# Patient Record
Sex: Female | Born: 1960 | Race: White | Hispanic: No | Marital: Married | Smoking: Never smoker
Health system: Southern US, Community
[De-identification: ages and names within clinical notes are randomized; demographics above are authoritative.]

## PROBLEM LIST (undated history)

## (undated) DIAGNOSIS — E2839 Other primary ovarian failure: Secondary | ICD-10-CM

## (undated) DIAGNOSIS — D649 Anemia, unspecified: Secondary | ICD-10-CM

## (undated) DIAGNOSIS — N952 Postmenopausal atrophic vaginitis: Secondary | ICD-10-CM

## (undated) DIAGNOSIS — R011 Cardiac murmur, unspecified: Secondary | ICD-10-CM

## (undated) DIAGNOSIS — E288 Other ovarian dysfunction: Secondary | ICD-10-CM

## (undated) DIAGNOSIS — H409 Unspecified glaucoma: Secondary | ICD-10-CM

## (undated) DIAGNOSIS — R51 Headache: Secondary | ICD-10-CM

## (undated) HISTORY — DX: Postmenopausal atrophic vaginitis: N95.2

## (undated) HISTORY — DX: Cardiac murmur, unspecified: R01.1

## (undated) HISTORY — DX: Unspecified glaucoma: H40.9

## (undated) HISTORY — DX: Other ovarian dysfunction: E28.8

## (undated) HISTORY — DX: Anemia, unspecified: D64.9

## (undated) HISTORY — DX: Other primary ovarian failure: E28.39

## (undated) HISTORY — DX: Headache: R51

---

## 1985-12-25 HISTORY — PX: TUBAL LIGATION: SHX77

## 1999-01-03 ENCOUNTER — Other Ambulatory Visit: Admission: RE | Admit: 1999-01-03 | Discharge: 1999-01-03 | Payer: Self-pay | Admitting: Gynecology

## 2000-04-17 ENCOUNTER — Other Ambulatory Visit: Admission: RE | Admit: 2000-04-17 | Discharge: 2000-04-17 | Payer: Self-pay | Admitting: Gynecology

## 2003-12-10 ENCOUNTER — Other Ambulatory Visit: Admission: RE | Admit: 2003-12-10 | Discharge: 2003-12-10 | Payer: Self-pay | Admitting: *Deleted

## 2005-04-17 ENCOUNTER — Other Ambulatory Visit: Admission: RE | Admit: 2005-04-17 | Discharge: 2005-04-17 | Payer: Self-pay | Admitting: Gynecology

## 2005-04-18 ENCOUNTER — Ambulatory Visit: Payer: Self-pay | Admitting: Internal Medicine

## 2005-11-08 ENCOUNTER — Ambulatory Visit (HOSPITAL_COMMUNITY): Admission: RE | Admit: 2005-11-08 | Discharge: 2005-11-08 | Payer: Self-pay | Admitting: Family Medicine

## 2007-02-02 ENCOUNTER — Emergency Department (HOSPITAL_COMMUNITY): Admission: EM | Admit: 2007-02-02 | Discharge: 2007-02-02 | Payer: Self-pay | Admitting: Emergency Medicine

## 2007-02-19 ENCOUNTER — Encounter (HOSPITAL_COMMUNITY): Admission: RE | Admit: 2007-02-19 | Discharge: 2007-03-21 | Payer: Self-pay | Admitting: Orthopaedic Surgery

## 2009-07-20 ENCOUNTER — Other Ambulatory Visit: Admission: RE | Admit: 2009-07-20 | Discharge: 2009-07-20 | Payer: Self-pay | Admitting: Obstetrics & Gynecology

## 2009-08-04 ENCOUNTER — Ambulatory Visit (HOSPITAL_COMMUNITY): Admission: RE | Admit: 2009-08-04 | Discharge: 2009-08-04 | Payer: Self-pay | Admitting: Obstetrics & Gynecology

## 2010-12-25 DIAGNOSIS — H409 Unspecified glaucoma: Secondary | ICD-10-CM

## 2010-12-25 HISTORY — DX: Unspecified glaucoma: H40.9

## 2011-12-13 ENCOUNTER — Other Ambulatory Visit (HOSPITAL_COMMUNITY): Payer: Self-pay | Admitting: Internal Medicine

## 2011-12-13 DIAGNOSIS — Z139 Encounter for screening, unspecified: Secondary | ICD-10-CM

## 2012-03-19 DIAGNOSIS — N952 Postmenopausal atrophic vaginitis: Secondary | ICD-10-CM

## 2012-03-19 HISTORY — DX: Postmenopausal atrophic vaginitis: N95.2

## 2012-03-19 LAB — HM PAP SMEAR: HM Pap smear: NORMAL

## 2013-02-10 ENCOUNTER — Encounter: Payer: Self-pay | Admitting: Nurse Practitioner

## 2013-03-21 ENCOUNTER — Encounter: Payer: Self-pay | Admitting: Nurse Practitioner

## 2013-03-21 ENCOUNTER — Ambulatory Visit: Payer: Self-pay | Admitting: Nurse Practitioner

## 2013-03-21 DIAGNOSIS — Z01419 Encounter for gynecological examination (general) (routine) without abnormal findings: Secondary | ICD-10-CM

## 2013-12-01 ENCOUNTER — Ambulatory Visit: Payer: Self-pay | Admitting: Nurse Practitioner

## 2013-12-08 ENCOUNTER — Encounter: Payer: Self-pay | Admitting: Nurse Practitioner

## 2013-12-08 ENCOUNTER — Ambulatory Visit (INDEPENDENT_AMBULATORY_CARE_PROVIDER_SITE_OTHER): Payer: Federal, State, Local not specified - PPO | Admitting: Nurse Practitioner

## 2013-12-08 VITALS — BP 138/92 | HR 80 | Ht 64.5 in | Wt 149.0 lb

## 2013-12-08 DIAGNOSIS — Z0000 Encounter for general adult medical examination without abnormal findings: Secondary | ICD-10-CM

## 2013-12-08 DIAGNOSIS — Z01419 Encounter for gynecological examination (general) (routine) without abnormal findings: Secondary | ICD-10-CM

## 2013-12-08 LAB — POCT URINALYSIS DIPSTICK
Bilirubin, UA: NEGATIVE
Blood, UA: NEGATIVE
Glucose, UA: NEGATIVE
Ketones, UA: NEGATIVE
Leukocytes, UA: NEGATIVE
Nitrite, UA: NEGATIVE
Protein, UA: NEGATIVE
Urobilinogen, UA: NEGATIVE
pH, UA: 8

## 2013-12-08 LAB — HEMOGLOBIN, FINGERSTICK: Hemoglobin, fingerstick: 11.8 g/dL — ABNORMAL LOW (ref 12.0–16.0)

## 2013-12-08 NOTE — Progress Notes (Signed)
Patient ID: Brooke Welch, female   DOB: 12/14/1961, 52 y.o.   MRN: [id number] 52 y.o. G27P2002 Married Caucasian Fe here for annual exam.  No new health problems.  Less vaginal dryness than last year.  No LMP recorded. Patient is postmenopausal.          Sexually active: yes  The current method of family planning is post menopausal status.    Exercising: yes  Home exercise routine includes treadmill every day. Smoker:  no  Health Maintenance: Pap:  03/19/12, WNL, neg HR HPV MMG:  04/03/12, Bi-Rads 2: benign findings Colonoscopy:  Never - declines BMD:   04/03/2012 TDaP:  03/19/12 Labs: HB: 11.8 Urine: negative, pH 8.0   reports that she has never smoked. She has never used smokeless tobacco. She reports that she does not drink alcohol or use illicit drugs.  Past Medical History  Diagnosis Date  . GERD (gastroesophageal reflux disease)   . 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 Heart murmur   . Premature ovarian failure age 70  . Headache(784.0)     migraine  . Anemia     hx.iron deficiency     Past Surgical History  Procedure Laterality Date  . Tubal ligation      Current Outpatient Prescriptions  Medication Sig Dispense Refill  . Calcium Carbonate-Vit D-Min (CALCIUM 1200 PO) Take 1 tablet by mouth daily.      . hydrOXYzine (ATARAX/VISTARIL) 25 MG tablet Take 1 tablet by mouth daily.      Marland Kitchen LUMIGAN 0.01 % SOLN Place 1 drop into both eyes at bedtime.      . potassium chloride (MICRO-K) 10 MEQ CR capsule Take 1 capsule by mouth daily.       No current facility-administered medications for this visit.    Family History  Problem Relation Age of Onset  . Diabetes Mother   . Hypertension Mother   . Diabetes Maternal Grandmother   . Hypertension Maternal Grandfather   . Heart attack Maternal Grandfather   . Hypertension Brother   . Heart disease Father     ROS:  Pertinent items are noted in HPI.  Otherwise, a comprehensive ROS was negative.  Exam:   BP 138/92  Pulse 80  Ht  5' 4.5" (1.638 m)  Wt 149 lb (67.586 kg)  BMI 25.19 kg/m2 Height: 5' 4.5" (163.8 cm)  Ht Readings from Last 3 Encounters:  12/08/13 5' 4.5" (1.638 m)    General appearance: alert, cooperative and appears stated age Head: Normocephalic, without obvious abnormality, atraumatic Neck: no adenopathy, supple, symmetrical, trachea midline and thyroid normal to inspection and palpation Lungs: clear to auscultation bilaterally Breasts: normal appearance, no masses or tenderness Heart: regular rate and rhythm Abdomen: soft, non-tender; no masses,  no organomegaly Extremities: extremities normal, atraumatic, no cyanosis or edema Skin: Skin color, texture, turgor normal. No rashes or lesions.  Multiple tattoos over back. Chest., right things and some on the arms. Lymph nodes: Cervical, supraclavicular, and axillary nodes normal. No abnormal inguinal nodes palpated Neurologic: Grossly normal   Pelvic: External genitalia:  no lesions              Urethra:  normal appearing urethra with no masses, tenderness or lesions              Bartholin's and Skene's: normal                 Vagina: normal appearing vagina with normal color and discharge, no lesions  Cervix: anteverted              Pap taken: no Bimanual Exam:  Uterus:  normal size, contour, position, consistency, mobility, non-tender              Adnexa: no mass, fullness, tenderness               Rectovaginal: Confirms               Anus:  normal sphincter tone, no lesions  A:  Well Woman with normal exam  Postmenopausal at age 41 - no HRT  P:   Pap smear as per guidelines  Mammo to be scheduled by end of the year  Will return for routine labs - and she will forward them to PCP  She has info on Colonoscopy when ready to schedule checking on insurance coverage   Counseled on breast self exam, mammography screening, adequate intake of calcium and vitamin D, diet and exercise return annually or prn  An After Visit Summary  was printed and given to the patient.

## 2013-12-08 NOTE — Patient Instructions (Signed)

## 2013-12-09 NOTE — Progress Notes (Signed)
Reviewed personally.  M. Suzanne Mariam Helbert, MD.  

## 2013-12-22 ENCOUNTER — Other Ambulatory Visit (INDEPENDENT_AMBULATORY_CARE_PROVIDER_SITE_OTHER): Payer: Federal, State, Local not specified - PPO

## 2013-12-22 DIAGNOSIS — Z0000 Encounter for general adult medical examination without abnormal findings: Secondary | ICD-10-CM

## 2013-12-22 LAB — LIPID PANEL
Cholesterol: 199 mg/dL (ref 0–200)
HDL: 56 mg/dL (ref >39)
LDL Cholesterol: 125 mg/dL — ABNORMAL HIGH (ref 0–99)
Total CHOL/HDL Ratio: 3.6 Ratio
Triglycerides: 88 mg/dL (ref <150)
VLDL: 18 mg/dL (ref 0–40)

## 2013-12-22 LAB — COMPREHENSIVE METABOLIC PANEL
ALT: 35 U/L (ref 0–35)
AST: 40 U/L — ABNORMAL HIGH (ref 0–37)
Albumin: 4.1 g/dL (ref 3.5–5.2)
Alkaline Phosphatase: 100 U/L (ref 39–117)
BUN: 17 mg/dL (ref 6–23)
CO2: 28 mEq/L (ref 19–32)
Calcium: 9.3 mg/dL (ref 8.4–10.5)
Chloride: 104 mEq/L (ref 96–112)
Creat: 0.61 mg/dL (ref 0.50–1.10)
Glucose, Bld: 91 mg/dL (ref 70–99)
Potassium: 4.6 mEq/L (ref 3.5–5.3)
Sodium: 139 mEq/L (ref 135–145)
Total Bilirubin: 0.5 mg/dL (ref 0.3–1.2)
Total Protein: 6 g/dL (ref 6.0–8.3)

## 2013-12-22 LAB — TSH: TSH: 1.919 u[IU]/mL (ref 0.350–4.500)

## 2013-12-23 ENCOUNTER — Telehealth: Payer: Self-pay | Admitting: *Deleted

## 2013-12-23 DIAGNOSIS — R945 Abnormal results of liver function studies: Secondary | ICD-10-CM

## 2013-12-23 LAB — VITAMIN D 25 HYDROXY (VIT D DEFICIENCY, FRACTURES): Vit D, 25-Hydroxy: 55 ng/mL (ref 30–89)

## 2013-12-23 NOTE — Telephone Encounter (Signed)
Pt returning call

## 2013-12-23 NOTE — Telephone Encounter (Signed)
I have attempted to contact this patient by phone with the following results: left message to return my call on answering machine (mobile).  

## 2013-12-23 NOTE — Telephone Encounter (Signed)
Message copied by Luisa Dago on Tue Dec 23, 2013  9:41 AM ------      Message from: Ria Comment R      Created: Tue Dec 23, 2013  8:32 AM       Let patient know that one liver function test is elevated.  She needs to hold any NSAID'S and ETOH and repeat LFT's in 2 weeks. All else was normal except elevated LDL at 125. ------

## 2013-12-23 NOTE — Telephone Encounter (Signed)
Pt notified of results.  Order entered, lab appt made.

## 2014-01-06 ENCOUNTER — Telehealth: Payer: Self-pay | Admitting: Nurse Practitioner

## 2014-01-06 ENCOUNTER — Other Ambulatory Visit: Payer: Self-pay

## 2014-01-06 NOTE — Telephone Encounter (Signed)
Called pt regarding missed lab appt. To call if she wish to reschedule.

## 2014-10-26 ENCOUNTER — Encounter: Payer: Self-pay | Admitting: Nurse Practitioner

## 2014-12-10 ENCOUNTER — Ambulatory Visit: Payer: Federal, State, Local not specified - PPO | Admitting: Nurse Practitioner

## 2015-02-01 ENCOUNTER — Encounter: Payer: Self-pay | Admitting: Nurse Practitioner

## 2015-02-01 ENCOUNTER — Ambulatory Visit: Payer: Federal, State, Local not specified - PPO | Admitting: Nurse Practitioner

## 2015-08-04 ENCOUNTER — Encounter: Payer: Self-pay | Admitting: Nurse Practitioner

## 2016-02-16 ENCOUNTER — Encounter: Payer: Self-pay | Admitting: Allergy and Immunology

## 2016-02-16 ENCOUNTER — Ambulatory Visit (INDEPENDENT_AMBULATORY_CARE_PROVIDER_SITE_OTHER): Payer: Federal, State, Local not specified - PPO | Admitting: Allergy and Immunology

## 2016-02-16 ENCOUNTER — Ambulatory Visit: Payer: Federal, State, Local not specified - PPO | Admitting: Allergy and Immunology

## 2016-02-16 VITALS — BP 120/80 | HR 78 | Temp 98.3°F | Resp 16 | Ht 64.17 in | Wt 145.9 lb

## 2016-02-16 DIAGNOSIS — J310 Chronic rhinitis: Secondary | ICD-10-CM | POA: Diagnosis not present

## 2016-02-16 DIAGNOSIS — R05 Cough: Secondary | ICD-10-CM | POA: Diagnosis not present

## 2016-02-16 DIAGNOSIS — L509 Urticaria, unspecified: Secondary | ICD-10-CM

## 2016-02-16 DIAGNOSIS — R059 Cough, unspecified: Secondary | ICD-10-CM

## 2016-02-16 MED ORDER — EPINEPHRINE 0.3 MG/0.3ML IJ SOAJ: 0.3000 mg | Freq: Once | INTRAMUSCULAR | Status: AC

## 2016-02-16 NOTE — Patient Instructions (Addendum)
   Prednisone  now and  once daily for 2 days then  once daily for 2 days.  Zyrtec  once each morning.  Continue Zantac  once daily.  Continue Hyrdroxyzine at bedtime as needed for itching/hives.  ProAir 2 puffs every 6 hours as needed for cough.  Epi-pen/Benadryl as needed.  Keep written diary of any further hive episodes and take picture.  Return to office in the next month with plans for skin testing off antihistamines 72 hours prior to appointment.

## 2016-02-16 NOTE — Progress Notes (Signed)
NEW PATIENT NOTE  RE: Brooke Welch MRN: [id number] DOB: 09-Jul-1961 [facility] [address] [place] [address] Date of Office Visit: 02/16/2016  Dear Brooke Nevins, MD:  I had the pleasure of seeing Brooke Welch today in initial evaluation as you recall-- Subjective:  [name] is a 55 y.o. female who presents today for Urticaria and Shortness of Breath  Assessment:   1. Cough, possible component of bronchospasm, clear lung exam with normal oxygenation.   2. Hives, recent episode improved on Prednisone.   3. Chronic rhinitis, suspected allergic etiology.    Plan:   Meds ordered this encounter  Medications  . EPINEPHrine (EPIPEN 2-PAK) 0.3 mg/0.3 mL IJ SOAJ injection    Sig: Inject 0.3 mLs (0.3 mg total) into the muscle once.    Dispense:  2 Device    Refill:  2  . albuterol (PROAIR HFA) 108 (90 Base) MCG/ACT inhaler    Sig: Inhale 2 puffs into the lungs every 6 (six) hours as needed for wheezing (or cough.).    Dispense:  1 Inhaler    Refill:  1   Patient Instructions  1.  Prednisone  now and  once daily for 2 days then  once daily for 2 days. 2.  Zyrtec  once each morning. 3.  Continue Zantac  once daily. 4.  Continue Hyrdroxyzine at bedtime as needed for itching/hives. 5.  ProAir 2 puffs every 6 hours as needed for cough. 6.  Epi-pen/Benadryl as needed. 7.  Keep written diary of any further hive episodes and take picture. 8.  Release signed for copy of recent lab testing results. 9.  Return to office in the next month with plans for skin testing off antihistamines 72 hours prior to appointment.   HPI: Brooke Welch, who has been evaluated in our office with chronic urticaria and intermittent rhinitis (2011) presents with intermittent pruritic erythematous migrating hives over recurring years.  Some time since her last visit, [name] reports having testing it appears with  Allergy with noted positives.  Apparently she was hive free for some time with rare episodes, typically not requiring frequent management and therefore did not follow-up here.  However, in the last several weeks she has noted significant pruritus at her extremities, neck, chest (without swelling of her lips, tongue or throat) only partial improvement with Benadryl.  Because of increasing difficulty, she saw her primary care physician 2 weeks ago and completed a course of prednisone and more consistent Benadryl use, then Atarax as well as selected laboratory evaluation for "auto-immune disease".  She notices in the recent days, intermittent throat irritation with throat clearing cough without wheezing, difficulty breathing, chest congestion or tightness.  Last night she felt her itching was greater with chest irritation, prompting administration of Benadryl and Zantac and her last scheduled dose of prednisone.  She denies any preceding febrile, respiratory or GI illness prior to her current skin difficulties.  No change in appetite, activity, sleep, environmental or product exposures. Yesterday lunch was oranges and dinner likely was cucumber, tomato, cheese salad with [place] dressing.  She denies any specific food exposures or triggers as consistent.   Typically she notices animal dander, pollen, outdoor and fluctuant  weather patterns, as provoking factors for rhinorrhea, congestion, sneezing. Denies ED or Urgent care visits, or antibiotic courses.  Medical History: Past Medical History  Diagnosis Date  . 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 Heart murmur   . Premature ovarian failure age 19  .  Headache(784.0)     migraine  . Anemia     hx.iron deficiency   . Glaucoma 2012   Surgical History: Past Surgical History  Procedure Laterality Date  . Tubal ligation  1987   Family History: Family History  Problem Relation Age of Onset  . Diabetes Mother   . Hypertension Mother   . Diabetes Maternal Grandmother   . Hypertension  Maternal Grandfather   . Heart attack Maternal Grandfather   . Hypertension Brother   . Asthma Brother   . Heart disease Father   . Asthma Father   . Urticaria Neg Hx   . Allergic rhinitis Neg Hx   . Angioedema Neg Hx   . Atopy Neg Hx   . Immunodeficiency Neg Hx   . Eczema Neg Hx    Social History: Social History  . Marital Status: Married    Spouse Name: N/A  . Number of Children: N/A  . Years of Education: N/A    Social History Main Topics  . Smoking status: Never Smoker   . Smokeless tobacco: Never Used  . Alcohol Use: No  . Drug Use: No  . 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   Brooke Welch has a current medication list which includes the following prescription(s): calcium carbonate-vit d-min, ferrous sulfate, hydrochlorothiazide, hydroxyzine, lumigan, timolol.   Drug Allergies: No Known Allergies  Environmental History: Brooke Welch lives in a 56 year old house for  8 years, wood floors, central air and heat; stuffed mattress, non feather pillow and comforter with indoor poodle and secondary smoke exposure.  Review of Systems  Constitutional: Negative for fever, weight loss and malaise/fatigue.  HENT: Positive for congestion. Negative for ear pain, hearing loss, nosebleeds and sore throat.   Eyes: Negative for blurred vision, discharge and redness.       Corrective contact lenses.  Respiratory: Negative for shortness of breath.        Denies history of bronchitis and pneumonia.  Gastrointestinal: Negative for heartburn, nausea, vomiting, abdominal pain, diarrhea and constipation.  Genitourinary: Negative.   Musculoskeletal: Negative for myalgias and joint pain.  Skin: Positive for itching and rash.  Neurological: Negative.  Negative for dizziness, seizures,  weakness and headaches.  Endo/Heme/Allergies: Positive for environmental allergies.       Denies sensitivity to aspirin, NSAIDs, stinging insects, foods, latex, jewelry and cosmetics.   Objective:   Filed Vitals:   02/16/16 1408  BP: 120/80  Pulse: 78  Temp: 98.3 F (36.8 C)  Resp: 16   SpO2 Readings from Last 1 Encounters:  02/16/16 98%   Physical Exam  Constitutional: She is well-developed, well-nourished, and in no distress.  HENT:  Head: Atraumatic.  Right Ear: Tympanic membrane and ear canal normal.  Left Ear: Tympanic membrane and ear canal normal.  Nose: Mucosal edema present. No rhinorrhea. No epistaxis.  Mouth/Throat: Oropharynx is clear and moist and mucous membranes are normal. No oropharyngeal exudate, posterior oropharyngeal edema or posterior oropharyngeal erythema.  Eyes: Conjunctivae are normal.  Neck: Neck supple.  Cardiovascular: Normal rate, S1 normal and S2 normal.   No murmur heard. Pulmonary/Chest: Effort normal. She has no wheezes. She has no rhonchi. She has no rales.  Abdominal: Soft. Normal appearance and bowel sounds are normal.  Musculoskeletal: She exhibits no edema.  Lymphadenopathy:    She  has no cervical adenopathy.  Neurological: She is alert.  Skin: Skin is warm and intact. No rash noted. No cyanosis. Nails show no clubbing.  Few tiny macular areas at right arm, otherwise clear.   Diagnostics: Spirometry:  FVC 2.05-60%, FEV1 1.90-71% , FEF 25-75 percent 2.74-108%.  Postbronchodilator essentially no change, (then repeat postbronchodilator on different spirometry machine showed improvement 1.99-64%, FEV1 1.88--75%, FEF 25-75% 2.91--112%).    Roselyn M. Willa Rough, MD   cc: Cassell Smiles., MD

## 2016-02-18 ENCOUNTER — Encounter: Payer: Self-pay | Admitting: Allergy and Immunology

## 2016-02-18 MED ORDER — ALBUTEROL SULFATE HFA 108 (90 BASE) MCG/ACT IN AERS: 2.0000 | INHALATION_SPRAY | Freq: Four times a day (QID) | RESPIRATORY_TRACT | Status: DC | PRN

## 2016-03-16 ENCOUNTER — Ambulatory Visit (INDEPENDENT_AMBULATORY_CARE_PROVIDER_SITE_OTHER): Payer: Federal, State, Local not specified - PPO | Admitting: Allergy and Immunology

## 2016-03-16 ENCOUNTER — Encounter: Payer: Self-pay | Admitting: Allergy and Immunology

## 2016-03-16 VITALS — BP 128/78 | HR 76 | Temp 98.1°F | Resp 16

## 2016-03-16 DIAGNOSIS — J310 Chronic rhinitis: Secondary | ICD-10-CM

## 2016-03-16 DIAGNOSIS — R05 Cough: Secondary | ICD-10-CM | POA: Diagnosis not present

## 2016-03-16 DIAGNOSIS — R059 Cough, unspecified: Secondary | ICD-10-CM

## 2016-03-16 DIAGNOSIS — L509 Urticaria, unspecified: Secondary | ICD-10-CM

## 2016-03-16 NOTE — Patient Instructions (Addendum)
  Review labs from Dr. Sherwood GamblerFusco and consider further evaluation.  Continue Zyrtec 10mg  twice daily.  Continue Zantac 150mg  twice daily.  Continue Hydroxyzine as needed.  Begin Singulair 10mg  each evening.  Information on Xolair.  Referral to Dermatology for biopsy---Dr. Ezequiel GanserHall [place].  Prednisone 10mg  daily for 3 days.

## 2016-03-17 ENCOUNTER — Telehealth: Payer: Self-pay

## 2016-03-17 NOTE — Telephone Encounter (Signed)
Spoke to patient and informed her about her  [place].  It is on May 3rd at 3pm.

## 2016-03-18 NOTE — Progress Notes (Signed)
     FOLLOW UP NOTE  RE: Brooke DanesCheryl M Mccranie MRN: [id number] DOB: 02-Aug-1961 [facility] [facility] [place] [address]  [place] [id number] Date of Office Visit: 03/16/2016  Subjective:  Brooke Welch is a 55 y.o. female who presents today for Urticaria and Face Swelling  Assessment:   1. Cough with clear lung exam.  2. Hives, recurrent unclear etiology.   3. Chronic rhinitis, presumed allergic component.   Plan:  1.  Review labs from Dr. Sherwood GamblerFusco and consider further laboratory evaluation. 2.  Continue Zyrtec 10mg  twice daily. 3.  Continue Zantac 150mg  twice daily. 4.  Continue Hydroxyzine as needed. 5.  Begin Singulair 10mg  each evening. 6.  Information on Xolair. 7.  Referral to Dermatology for biopsy---Dr. Ezequiel GanserHall [place]. 8.  Prednisone 10mg  daily for 3 days. 9.  Epi-pen/Benadryl as needed. 10. Follow-up by phone with additional management/appointment date and then in 2-3 months or sooner if needed.  HPI: Brooke Welch returns to the office with continued difficulty with hives. Since her Feb visit, she did well on prednisone with 100% improvement but several days later had increasing difficulty.  Eventually she spoke with Dr. Nunzio CobbsBobbitt on call, who increased her Zyrtec and Zantac and using Hydroxyzine regularly with a few additional Prednisone days.  She has continued to have several days of hives, most disruptive at her face/cheeks where areas are often quarter sized with significant itching.  No bruising or other skin changes.  She shows additional pictures today but does not appreciate specific triggers except increasing stressors related to her daughter.  Denies cough, difficulty in breathing, shortness of breath, fever, headache, GI upset or other associated symptoms.   No recent albuterol use. Denies ED or urgent care visits or antibiotic courses. Reports sleep and activity are normal.  Brooke Welch has a current medication list which includes the following  prescription(s): albuterol, calcium carbonate-vit d-min, cetirizine, epinephrine, ferrous sulfate, hydroxyzine, lumigan, ranitidine.   Drug Allergies: No Known Allergies  Objective:   Filed Vitals:   03/16/16 1042  BP: 128/78  Pulse: 76  Temp: 98.1 F (36.7 C)  Resp: 16   Physical Exam  Constitutional: She is well-developed, well-nourished, and in no distress.  HENT:  Head: Atraumatic.  Right Ear: Tympanic membrane and ear canal normal.  Left Ear: Tympanic membrane and ear canal normal.  Nose: Mucosal edema present. No rhinorrhea. No epistaxis.  Mouth/Throat: Oropharynx is clear and moist and mucous membranes are normal. No oropharyngeal exudate, posterior oropharyngeal edema or posterior oropharyngeal erythema.  Neck: Neck supple.  Cardiovascular: Normal rate, S1 normal and S2 normal.   No murmur heard. Pulmonary/Chest: Effort normal. She has no wheezes. She has no rhonchi. She has no rales.  Musculoskeletal:  No joint swelling or tenderness.  Lymphadenopathy:    She has no cervical adenopathy.  Skin: Skin is warm and intact. Rash noted. Rash is urticarial (blanching erythematous circular lesions greatest at arms and right cheek, without any bruising or other skin discoloration.).   Diagnostics: Spirometry:  FVC 2.00--65% FEV1 1.85--74%.    Roselyn M. Willa RoughHicks, MD  cc: Cassell SmilesFUSCO,LAWRENCE J., MD

## 2016-04-18 ENCOUNTER — Telehealth: Payer: Self-pay | Admitting: *Deleted

## 2016-04-18 NOTE — Telephone Encounter (Signed)
PT WANTS TO MAKE SURE WE FILED WITH BOTH OF HER INSURANCES.

## 2016-04-18 NOTE — Telephone Encounter (Signed)
LEFT MESSAGE - 0 PT BAL - MARCH VISIT PENDING WITH SECONDARY INS

## 2019-02-04 NOTE — Progress Notes (Signed)
Office Visit Note  Patient: Brooke Welch             Date of Birth: [birth year]           MRN: [id number]             PCP: Elfredia NevinsFusco, Lawrence, MD Referring: Nita SellsHall, John, MD Visit Date: 02/18/2019 Occupation: HR   Subjective:  History of hives.   History of Present Illness: Brooke Welch is a 58 y.o. female seen in consultation per request of Dr. Margo AyeHall.  According to patient her symptoms are started at age 89.  She has been having episodes of hives EN swelling of her mouth and fingers with the episodes.  She states she has recurrent problems since then.  She has been treated with several courses of steroids over the time.  She also has been taking antihistamines and Zantac.  She states the symptoms have been recurrent over the years.  She has had multiple allergy testing which was not very helpful.  She states her last episode was most severe where she had throat closing sensation.  She was given Depo-Medrol, oral prednisone and Tagamet which helped.  She has to take Tagamet and antihistamines on a regular basis.  She recalls in April 2017 she had some work-up done which showed positive ANA and positive SSB antibody for which she was seen by rheumatologist who also offered prednisone for treatment.  She denies any sicca symptoms.  There is no history of any joint pain or joint swelling in between the episodes.  There is no history of photosensitivity, Raynaud's phenomenon, malar rash, oral ulcers.  There is no family history of autoimmune disease.  Activities of Daily Living:  Patient reports morning stiffness for0 minute.   Patient Denies nocturnal pain.  Difficulty dressing/grooming: Denies Difficulty climbing stairs: Denies Difficulty getting out of chair: Denies Difficulty using hands for taps, buttons, cutlery, and/or writing: Denies  Review of Systems  Constitutional: Positive for fatigue. Negative for night sweats, weight gain and weight loss.  HENT: Negative for mouth sores, trouble  swallowing, trouble swallowing, mouth dryness and nose dryness.   Eyes: Negative for pain, redness, visual disturbance and dryness.  Respiratory: Negative for cough, shortness of breath and difficulty breathing.   Cardiovascular: Negative for chest pain, palpitations, hypertension, irregular heartbeat and swelling in legs/feet.  Gastrointestinal: Negative for blood in stool, constipation and diarrhea.  Endocrine: Negative for increased urination.  Genitourinary: Negative for vaginal dryness.  Musculoskeletal: Negative for arthralgias, joint pain, joint swelling, myalgias, muscle weakness, morning stiffness, muscle tenderness and myalgias.  Skin: Negative for color change, rash, hair loss, skin tightness, ulcers and sensitivity to sunlight.  Allergic/Immunologic: Negative for susceptible to infections.  Neurological: Negative for dizziness, memory loss, night sweats and weakness.  Hematological: Negative for swollen glands.  Psychiatric/Behavioral: Positive for sleep disturbance. Negative for depressed mood. The patient is not nervous/anxious.     PMFS History:  Patient Active Problem List   Diagnosis Date Noted  . History of anemia 02/18/2019  . 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 History of glaucoma 02/18/2019  . Heart murmur 02/18/2019  . History of asthma 02/18/2019    Past Medical History:  Diagnosis Date  . Anemia    hx.iron deficiency   . 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 Glaucoma 2012  . Headache(784.0)    migraine  . Heart murmur   . Premature ovarian failure age 58    Family History  Problem Relation Age of Onset  .  Diabetes Mother   . Hypertension Mother   . Diabetes Maternal Grandmother   . Hypertension Maternal Grandfather   . Heart attack Maternal Grandfather   . Hypertension Brother   . Asthma Brother   . Heart disease Father   . Asthma Father   . Healthy Son   . Drug abuse Daughter   . Urticaria Neg Hx   . Allergic rhinitis Neg Hx   . Angioedema Neg Hx    . Atopy Neg Hx   . Immunodeficiency Neg Hx   . Eczema Neg Hx    Past Surgical History:  Procedure Laterality Date  . TUBAL LIGATION  1987   Social History   Social History Narrative  . Not on file   Immunization History  Administered Date(s) Administered  . Tdap 03/19/2012     Objective: Vital Signs: BP (!) 162/103 (BP Location: Right Arm, Patient Position: Sitting, Cuff Size: Normal)   Pulse 77   Resp 12   Ht 5' 3.75" (1.619 m)   Wt 149 lb 9.6 oz (67.9 kg)   BMI 25.88 kg/m    Physical Exam Vitals signs and nursing note reviewed.  Constitutional:      Appearance: She is well-developed.  HENT:     Head: Normocephalic and atraumatic.  Eyes:     Conjunctiva/sclera: Conjunctivae normal.  Neck:     Musculoskeletal: Normal range of motion.  Cardiovascular:     Rate and Rhythm: Normal rate and regular rhythm.     Heart sounds: Normal heart sounds.  Pulmonary:     Effort: Pulmonary effort is normal.     Breath sounds: Normal breath sounds.  Abdominal:     General: Bowel sounds are normal.     Palpations: Abdomen is soft.  Lymphadenopathy:     Cervical: No cervical adenopathy.  Skin:    General: Skin is warm and dry.     Capillary Refill: Capillary refill takes less than 2 seconds.  Neurological:     Mental Status: She is alert and oriented to person, place, and time.  Psychiatric:        Behavior: Behavior normal.      Musculoskeletal Exam: C-spine, thoracic and lumbar spine good range of motion.  Shoulder joints elbow joints wrist joint MCPs PIPs DIPs with good range of motion with no synovitis.  Hip joints knee joints ankles MTPs PIPs been good range of motion with no synovitis.  CDAI Exam: CDAI Score: Not documented Patient Global Assessment: Not documented; Provider Global Assessment: Not documented Swollen: Not documented; Tender: Not documented Joint Exam   Not documented   There is currently no information documented on the homunculus. Go to the  Rheumatology activity and complete the homunculus joint exam.  Investigation: No additional findings.  Imaging: No results found.  Recent Labs: Lab Results  Component Value Date   HGB 11.8 (L) 12/08/2013   NA 139 12/22/2013   K 4.6 12/22/2013   CL 104 12/22/2013   CO2 28 12/22/2013   GLUCOSE 91 12/22/2013   BUN 17 12/22/2013   CREATININE 0.61 12/22/2013   BILITOT 0.5 12/22/2013   ALKPHOS 100 12/22/2013   AST 40 (H) 12/22/2013   ALT 35 12/22/2013   PROT 6.0 12/22/2013   ALBUMIN 4.1 12/22/2013   CALCIUM 9.3 12/22/2013    Speciality Comments: No specialty comments available.  Procedures:  No procedures performed Allergies: Patient has no known allergies.   Assessment / Plan:     Visit Diagnoses: Urticaria -patient has had history  of urticaria since age 89.  She has had recurrent episodes for many years.  She has extensive allergy testing in the past.  She is also been evaluated by rheumatologist in the past.  She had low titer positive ANA and positive antibody.  She has no clinical features of sicca symptoms.  She has been given prednisone taper frequently for severe hives.  She states the last episode in January was the most severe when she had throat closing experience.  The labs reviewed from 2017: sed rate 14, CRP<0.5, ANA 1:40 Homogeneous, Ro-, La 1.4, Smith-Bx 2017-showed urticaria.  I will obtain AVISE labs today.  History of asthma-patient uses inhalers on PRN basis.  Heart murmur  History of glaucoma  Premature ovarian failure  History of anemia   Orders: No orders of the defined types were placed in this encounter.  No orders of the defined types were placed in this encounter.   Face-to-face time spent with patient was 45 minutes. Greater than 50% of time was spent in counseling and coordination of care.  Follow-Up Instructions: Return for Urticaria and positive ANA.   Pollyann SavoyShaili Rehana Uncapher, MD  Note - This record has been created using Animal nutritionistDragon software.    Chart creation errors have been sought, but may not always  have been located. Such creation errors do not reflect on  the standard of medical care.

## 2019-02-18 ENCOUNTER — Encounter (INDEPENDENT_AMBULATORY_CARE_PROVIDER_SITE_OTHER): Payer: Self-pay

## 2019-02-18 ENCOUNTER — Ambulatory Visit: Payer: Federal, State, Local not specified - PPO | Admitting: Rheumatology

## 2019-02-18 ENCOUNTER — Ambulatory Visit (INDEPENDENT_AMBULATORY_CARE_PROVIDER_SITE_OTHER): Payer: Federal, State, Local not specified - PPO | Admitting: Rheumatology

## 2019-02-18 ENCOUNTER — Encounter: Payer: Self-pay | Admitting: Rheumatology

## 2019-02-18 VITALS — BP 162/103 | HR 77 | Resp 12 | Ht 63.75 in | Wt 149.6 lb

## 2019-02-18 DIAGNOSIS — L509 Urticaria, unspecified: Secondary | ICD-10-CM

## 2019-02-18 DIAGNOSIS — Z8669 Personal history of other diseases of the nervous system and sense organs: Secondary | ICD-10-CM | POA: Diagnosis not present

## 2019-02-18 DIAGNOSIS — E2839 Other primary ovarian failure: Secondary | ICD-10-CM

## 2019-02-18 DIAGNOSIS — Z8709 Personal history of other diseases of the respiratory system: Secondary | ICD-10-CM

## 2019-02-18 DIAGNOSIS — E288 Other ovarian dysfunction: Secondary | ICD-10-CM

## 2019-02-18 DIAGNOSIS — R011 Cardiac murmur, unspecified: Secondary | ICD-10-CM

## 2019-02-18 DIAGNOSIS — Z862 Personal history of diseases of the blood and blood-forming organs and certain disorders involving the immune mechanism: Secondary | ICD-10-CM

## 2019-03-03 NOTE — Progress Notes (Deleted)
Office Visit Note  Patient: Brooke Welch             Date of Birth: [birth year]           MRN: [id number]             PCP: [name], MD Referring: [name], MD Visit Date: 03/13/2019 Occupation: @GUAROCC @  Subjective:  No chief complaint on file.   History of Present Illness: Brooke Welch is a 58 y.o. female ***   Activities of Daily Living:  Patient reports morning stiffness for *** {minute/hour:19697}.   Patient {ACTIONS;DENIES/REPORTS:21021675::"Denies"} nocturnal pain.  Difficulty dressing/grooming: {ACTIONS;DENIES/REPORTS:21021675::"Denies"} Difficulty climbing stairs: {ACTIONS;DENIES/REPORTS:21021675::"Denies"} Difficulty getting out of chair: {ACTIONS;DENIES/REPORTS:21021675::"Denies"} Difficulty using hands for taps, buttons, cutlery, and/or writing: {ACTIONS;DENIES/REPORTS:21021675::"Denies"}  No Rheumatology ROS completed.   PMFS History:  Patient Active Problem List   Diagnosis Date Noted  . History of anemia 02/18/2019  . 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 History of glaucoma 02/18/2019  . Heart murmur 02/18/2019  . History of asthma 02/18/2019    Past Medical History:  Diagnosis Date  . Anemia    hx.iron deficiency   . 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 Glaucoma 2012  . Headache(784.0)    migraine  . Heart murmur   . Premature ovarian failure age 36    Family History  Problem Relation Age of Onset  . Diabetes Mother   . Hypertension Mother   . Diabetes Maternal Grandmother   . Hypertension Maternal Grandfather   . Heart attack Maternal Grandfather   . Hypertension Brother   . Asthma Brother   . Heart disease Father   . Asthma Father   . Healthy Son   . Drug abuse Daughter   . Urticaria Neg Hx   . Allergic rhinitis Neg Hx   . Angioedema Neg Hx   . Atopy Neg Hx   . Immunodeficiency Neg Hx   . Eczema Neg Hx    Past Surgical History:  Procedure Laterality Date  . TUBAL LIGATION  1987   Social History   Social  History Narrative  . Not on file   Immunization History  Administered Date(s) Administered  . Tdap 03/19/2012     Objective: Vital Signs: There were no vitals taken for this visit.   Physical Exam   Musculoskeletal Exam: ***  CDAI Exam: CDAI Score: Not documented Patient Global Assessment: Not documented; Provider Global Assessment: Not documented Swollen: Not documented; Tender: Not documented Joint Exam   Not documented   There is currently no information documented on the homunculus. Go to the Rheumatology activity and complete the homunculus joint exam.  Investigation: No additional findings.  Imaging: No results found.  Recent Labs: Lab Results  Component Value Date   HGB 11.8 (L) 12/08/2013   NA 139 12/22/2013   K 4.6 12/22/2013   CL 104 12/22/2013   CO2 28 12/22/2013   GLUCOSE 91 12/22/2013   BUN 17 12/22/2013   CREATININE 0.61 12/22/2013   BILITOT 0.5 12/22/2013   ALKPHOS 100 12/22/2013   AST 40 (H) 12/22/2013   ALT 35 12/22/2013   PROT 6.0 12/22/2013   ALBUMIN 4.1 12/22/2013   CALCIUM 9.3 12/22/2013  April 25, 2020AVISE lupus index -1.1, ANA positive, titer negative, ENA negative, CB CAP negative, Jo 1-, RF negative, anti-CCP negative, anticardiolipin negative, beta-2 GP 1-, antithyroglobulin negative, anti-TPO negative  Speciality Comments: No specialty comments available.  Procedures:  No procedures performed Allergies: Patient has no known allergies.   Assessment / Plan:  Visit Diagnoses: No diagnosis found.   Orders: No orders of the defined types were placed in this encounter.  No orders of the defined types were placed in this encounter.   Face-to-face time spent with patient was *** minutes. Greater than 50% of time was spent in counseling and coordination of care.  Follow-Up Instructions: No follow-ups on file.   Pollyann Savoy, MD  Note - This record has been created using Animal nutritionist.  Chart creation errors have  been sought, but may not always  have been located. Such creation errors do not reflect on  the standard of medical care.

## 2019-03-13 ENCOUNTER — Ambulatory Visit (INDEPENDENT_AMBULATORY_CARE_PROVIDER_SITE_OTHER): Payer: Federal, State, Local not specified - PPO | Admitting: Rheumatology

## 2019-03-13 ENCOUNTER — Ambulatory Visit: Payer: Federal, State, Local not specified - PPO | Admitting: Rheumatology

## 2019-03-13 ENCOUNTER — Encounter: Payer: Self-pay | Admitting: Rheumatology

## 2019-03-13 ENCOUNTER — Other Ambulatory Visit: Payer: Self-pay

## 2019-03-13 VITALS — BP 163/102 | HR 76 | Resp 13 | Ht 63.75 in | Wt 152.0 lb

## 2019-03-13 DIAGNOSIS — Z8669 Personal history of other diseases of the nervous system and sense organs: Secondary | ICD-10-CM

## 2019-03-13 DIAGNOSIS — L509 Urticaria, unspecified: Secondary | ICD-10-CM

## 2019-03-13 DIAGNOSIS — Z8709 Personal history of other diseases of the respiratory system: Secondary | ICD-10-CM

## 2019-03-13 DIAGNOSIS — R768 Other specified abnormal immunological findings in serum: Secondary | ICD-10-CM | POA: Diagnosis not present

## 2019-03-13 DIAGNOSIS — Z862 Personal history of diseases of the blood and blood-forming organs and certain disorders involving the immune mechanism: Secondary | ICD-10-CM | POA: Diagnosis not present

## 2019-03-13 DIAGNOSIS — R011 Cardiac murmur, unspecified: Secondary | ICD-10-CM

## 2019-03-13 NOTE — Progress Notes (Signed)
Office Visit Note  Patient: Brooke Welch             Date of Birth: [birth year]           MRN: [id number]             PCP: [name], MD Referring: [name], MD Visit Date: 03/13/2019 Occupation: @GUAROCC @  Subjective:  Urticaria.   History of Present Illness: Brooke Welch is a 58 y.o. female with history of recurrent urticaria.  Patient states she gets rash almost on daily basis.  She had an episode about a week ago when she developed rash all over.  She states she was seen by Dr. Margo Aye and had prednisone shot and prednisone taper.  She states the symptoms has been there since she was 58 years old.  She denies any joint pain or joint swelling.  Activities of Daily Living:  Patient reports morning stiffness for 5-10 minutes.   Patient Denies nocturnal pain.  Difficulty dressing/grooming: Denies Difficulty climbing stairs: Denies Difficulty getting out of chair: Denies Difficulty using hands for taps, buttons, cutlery, and/or writing: Denies  Review of Systems  Constitutional: Positive for fatigue. Negative for night sweats, weight gain and weight loss.  HENT: Positive for mouth dryness. Negative for mouth sores, trouble swallowing, trouble swallowing, nose dryness and sore tongue.        Nose sores   Eyes: Positive for dryness. Negative for pain, redness, itching and visual disturbance.  Respiratory: Negative for cough, shortness of breath, wheezing and difficulty breathing.   Cardiovascular: Negative for chest pain, palpitations, hypertension, irregular heartbeat and swelling in legs/feet.  Gastrointestinal: Negative for abdominal pain, blood in stool, constipation and diarrhea.  Endocrine: Negative for excessive thirst and increased urination.  Genitourinary: Negative for pelvic pain and vaginal dryness.  Musculoskeletal: Positive for arthralgias, joint pain and morning stiffness. Negative for joint swelling, myalgias, muscle weakness, muscle tenderness and  myalgias.  Skin: Positive for color change and rash. Negative for hair loss, skin tightness, ulcers and sensitivity to sunlight.  Allergic/Immunologic: Negative for susceptible to infections.  Neurological: Positive for numbness. Negative for dizziness, headaches, memory loss, night sweats and weakness.  Hematological: Negative for swollen glands.  Psychiatric/Behavioral: Negative for depressed mood, confusion and sleep disturbance. The patient is not nervous/anxious.     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   Glaucoma 2012   Headache(784.0)    migraine   Heart murmur    Premature ovarian failure age 25    Family History  Problem Relation Age of Onset   Diabetes Mother    Hypertension Mother    Diabetes Maternal Grandmother    Hypertension Maternal Grandfather    Heart attack Maternal Grandfather    Hypertension Brother    Asthma Brother    Heart disease Father    Asthma Father    Healthy Son    Drug abuse Daughter    Urticaria Neg Hx    Allergic rhinitis Neg Hx    Angioedema Neg Hx    Atopy Neg Hx    Immunodeficiency Neg Hx    Eczema Neg Hx    Past Surgical History:  Procedure  Laterality Date   TUBAL LIGATION  1987   Social History   Social History Narrative   Not on file   Immunization History  Administered Date(s) Administered   Tdap 03/19/2012     Objective: Vital Signs: BP (!) 163/102 (BP Location: Left Arm, Patient Position: Sitting, Cuff Size: Normal)    Pulse 76    Resp 13    Ht 5' 3.75" (1.619 m)    Wt 152 lb (68.9 kg)    BMI 26.30 kg/m    Physical Exam Vitals signs and nursing note reviewed.  Constitutional:       Appearance: She is well-developed.  HENT:     Head: Normocephalic and atraumatic.  Eyes:     Conjunctiva/sclera: Conjunctivae normal.  Neck:     Musculoskeletal: Normal range of motion.  Cardiovascular:     Rate and Rhythm: Normal rate and regular rhythm.     Heart sounds: Normal heart sounds.  Pulmonary:     Effort: Pulmonary effort is normal.     Breath sounds: Normal breath sounds.  Abdominal:     General: Bowel sounds are normal.     Palpations: Abdomen is soft.  Lymphadenopathy:     Cervical: No cervical adenopathy.  Skin:    General: Skin is warm and dry.     Capillary Refill: Capillary refill takes less than 2 seconds.     Findings: Rash present.     Comments: Urticaria noted on her abdomen.  Neurological:     Mental Status: She is alert and oriented to person, place, and time.  Psychiatric:        Behavior: Behavior normal.      Musculoskeletal Exam: On thoracic and lumbar spine good range of motion.  Shoulder joints elbow joints wrist joint MCPs PIPs DIPs been good range of motion with no synovitis.  Hip joints knee joints ankles MTPs PIPs been good range of motion with no synovitis.  CDAI Exam: CDAI Score: Not documented Patient Global Assessment: Not documented; Provider Global Assessment: Not documented Swollen: Not documented; Tender: Not documented Joint Exam   Not documented   There is currently no information documented on the homunculus. Go to the Rheumatology activity and complete the homunculus joint exam.  Investigation: No additional findings.  Imaging: No results found.  Recent Labs: Lab Results  Component Value Date   HGB 11.8 (L) 12/08/2013   NA 139 12/22/2013   K 4.6 12/22/2013   CL 104 12/22/2013   CO2 28 12/22/2013   GLUCOSE 91 12/22/2013   BUN 17 12/22/2013   CREATININE 0.61 12/22/2013   BILITOT 0.5 12/22/2013   ALKPHOS 100 12/22/2013   AST 40 (H) 12/22/2013   ALT 35 12/22/2013   PROT 6.0 12/22/2013   ALBUMIN 4.1 12/22/2013     CALCIUM 9.3 12/22/2013  February 18, 2019 AVISE text -1.1, ANA positive titer negative, ENA negative, CB CAP negative, anticardiolipin negative, beta-2 negative, RF negative, anti-CCP negative, antithyroglobulin negative TPO negative  Speciality Comments: No specialty comments available.  Procedures:  No procedures performed Allergies: Patient has no known allergies.   Assessment / Plan:     Visit Diagnoses: Urticaria-gives history of recurrent urticaria since she was 58 years old.  She has been seen by allergist and dermatologist in the past.  She gets frequent prednisone taper.  She had a prednisone taper about a week ago.  She had few lesions on her abdomen today.  Positive ANA (antinuclear antibody) - ANA positive titer negative, ENA negative, CB  CAP negative, anticardiolipin negative, beta-2 negative, RF negative, anti-CCP negative, antithyroglobulin negative, anti-TPO negative.  All autoimmune work-up is negative.  Abs were discussed at length.  At this point I do not believe that she has underlying autoimmune disease.  She gives history of intermittent have rainouts during the wintertime.  Have advised her to contact us in case her symptoms get worse.  Other medical problems are listed as follows:  History of anemia  History of asthma  History of glaucoma  Heart murmur   Orders: No orders of the defined types were placed in this encounter.  No orders of the defined types were placed in this encounter.    Follow-Up Instructions: Return if symptoms worsen or fail to improve.   Pollyann Savoy, MD  Note - This record has been created using Animal nutritionist.  Chart creation errors have been sought, but may not always  have been located. Such creation errors do not reflect on  the standard of medical care.

## 2019-10-24 ENCOUNTER — Other Ambulatory Visit: Payer: Self-pay

## 2019-10-24 DIAGNOSIS — Z20822 Contact with and (suspected) exposure to covid-19: Secondary | ICD-10-CM

## 2019-10-25 LAB — NOVEL CORONAVIRUS, NAA: SARS-CoV-2, NAA: NOT DETECTED

## 2020-09-30 DIAGNOSIS — H401132 Primary open-angle glaucoma, bilateral, moderate stage: Secondary | ICD-10-CM | POA: Diagnosis not present

## 2021-01-04 DIAGNOSIS — K080 Exfoliation of teeth due to systemic causes: Secondary | ICD-10-CM | POA: Diagnosis not present

## 2021-01-21 DIAGNOSIS — Z23 Encounter for immunization: Secondary | ICD-10-CM | POA: Diagnosis not present

## 2021-03-14 DIAGNOSIS — Z1231 Encounter for screening mammogram for malignant neoplasm of breast: Secondary | ICD-10-CM | POA: Diagnosis not present

## 2021-03-14 DIAGNOSIS — Z01419 Encounter for gynecological examination (general) (routine) without abnormal findings: Secondary | ICD-10-CM | POA: Diagnosis not present

## 2021-03-14 DIAGNOSIS — Z130 Encounter for screening for diseases of the blood and blood-forming organs and certain disorders involving the immune mechanism: Secondary | ICD-10-CM | POA: Diagnosis not present

## 2021-03-14 DIAGNOSIS — Z1389 Encounter for screening for other disorder: Secondary | ICD-10-CM | POA: Diagnosis not present

## 2021-03-14 DIAGNOSIS — E669 Obesity, unspecified: Secondary | ICD-10-CM | POA: Diagnosis not present

## 2021-03-15 DIAGNOSIS — Z01419 Encounter for gynecological examination (general) (routine) without abnormal findings: Secondary | ICD-10-CM | POA: Diagnosis not present

## 2021-04-02 ENCOUNTER — Other Ambulatory Visit: Payer: Self-pay | Admitting: Obstetrics and Gynecology

## 2021-04-02 DIAGNOSIS — N6489 Other specified disorders of breast: Secondary | ICD-10-CM

## 2021-04-22 ENCOUNTER — Other Ambulatory Visit: Payer: Self-pay

## 2021-05-02 ENCOUNTER — Other Ambulatory Visit: Payer: Self-pay

## 2021-05-02 ENCOUNTER — Other Ambulatory Visit: Payer: Self-pay | Admitting: Obstetrics and Gynecology

## 2021-05-02 ENCOUNTER — Ambulatory Visit
Admission: RE | Admit: 2021-05-02 | Discharge: 2021-05-02 | Disposition: A | Discharge status: Home / Self Care | Payer: Federal, State, Local not specified - PPO | Source: Ambulatory Visit | Attending: Obstetrics and Gynecology | Admitting: Obstetrics and Gynecology

## 2021-05-02 ENCOUNTER — Ambulatory Visit: Payer: Self-pay

## 2021-05-02 DIAGNOSIS — N6489 Other specified disorders of breast: Secondary | ICD-10-CM

## 2021-05-02 DIAGNOSIS — R922 Inconclusive mammogram: Secondary | ICD-10-CM | POA: Diagnosis not present

## 2021-05-05 ENCOUNTER — Other Ambulatory Visit: Payer: Self-pay

## 2021-06-14 DIAGNOSIS — H401120 Primary open-angle glaucoma, left eye, stage unspecified: Secondary | ICD-10-CM | POA: Diagnosis not present

## 2021-07-20 DIAGNOSIS — L508 Other urticaria: Secondary | ICD-10-CM | POA: Diagnosis not present

## 2021-12-06 DIAGNOSIS — H401112 Primary open-angle glaucoma, right eye, moderate stage: Secondary | ICD-10-CM | POA: Diagnosis not present

## 2021-12-24 IMAGING — MG MM DIGITAL DIAGNOSTIC UNILAT*R* W/ TOMO W/ CAD
4 series · 4 of 12 positions shown · non-contrast
Comparison: Previous exam(s).

CLINICAL DATA: Patient returns after screening for evaluation of
possible RIGHT breast asymmetry.

EXAM:
DIGITAL DIAGNOSTIC UNILATERAL RIGHT MAMMOGRAM WITH TOMOSYNTHESIS AND
CAD
TECHNIQUE: Right digital diagnostic mammography and breast tomosynthesis was
performed. The images were evaluated with computer-aided detection.

[R MLO synth-2D]
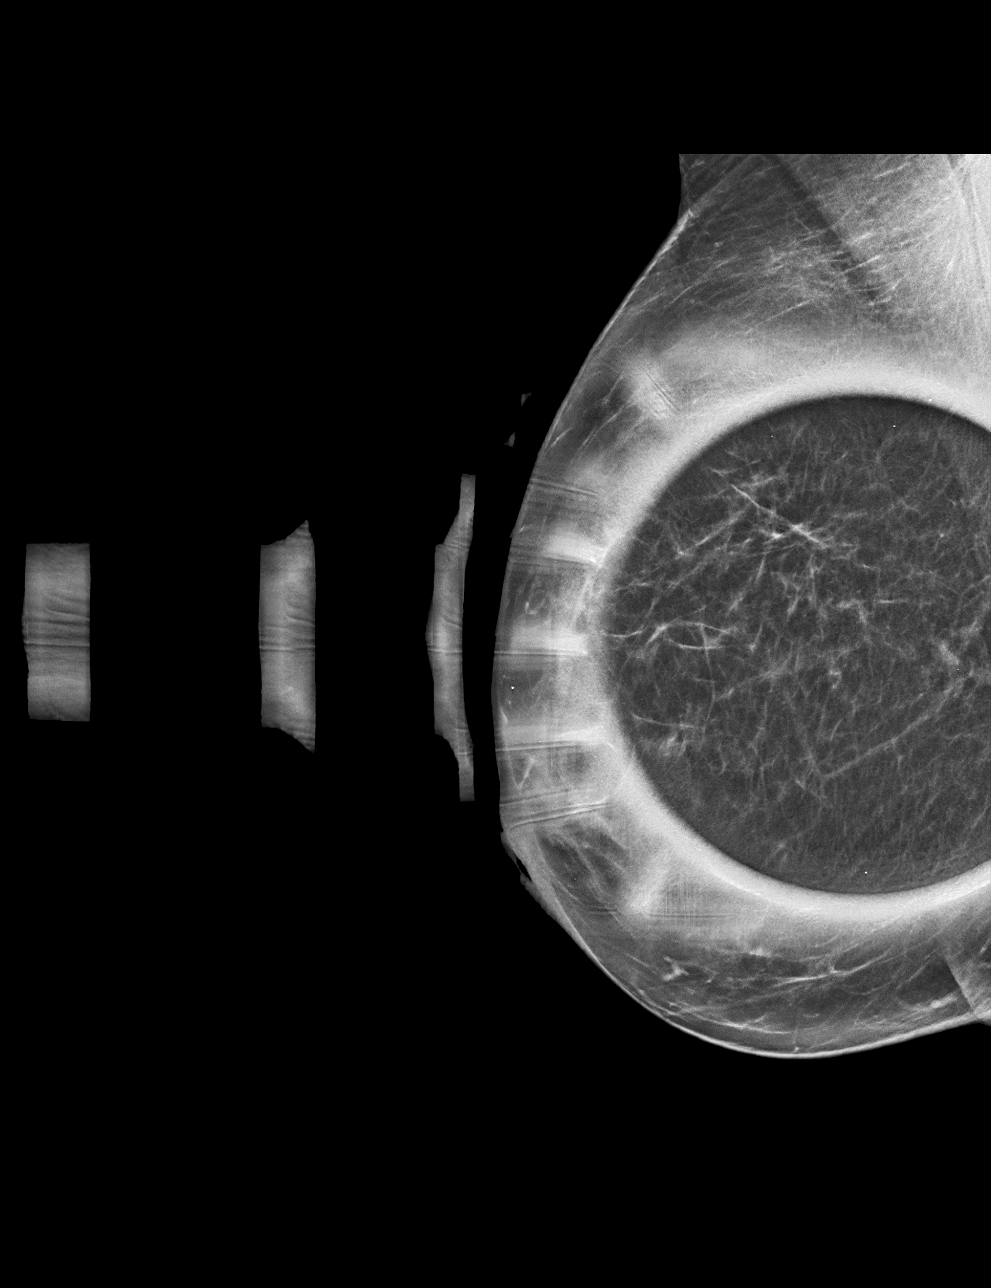

[R CC synth-2D]
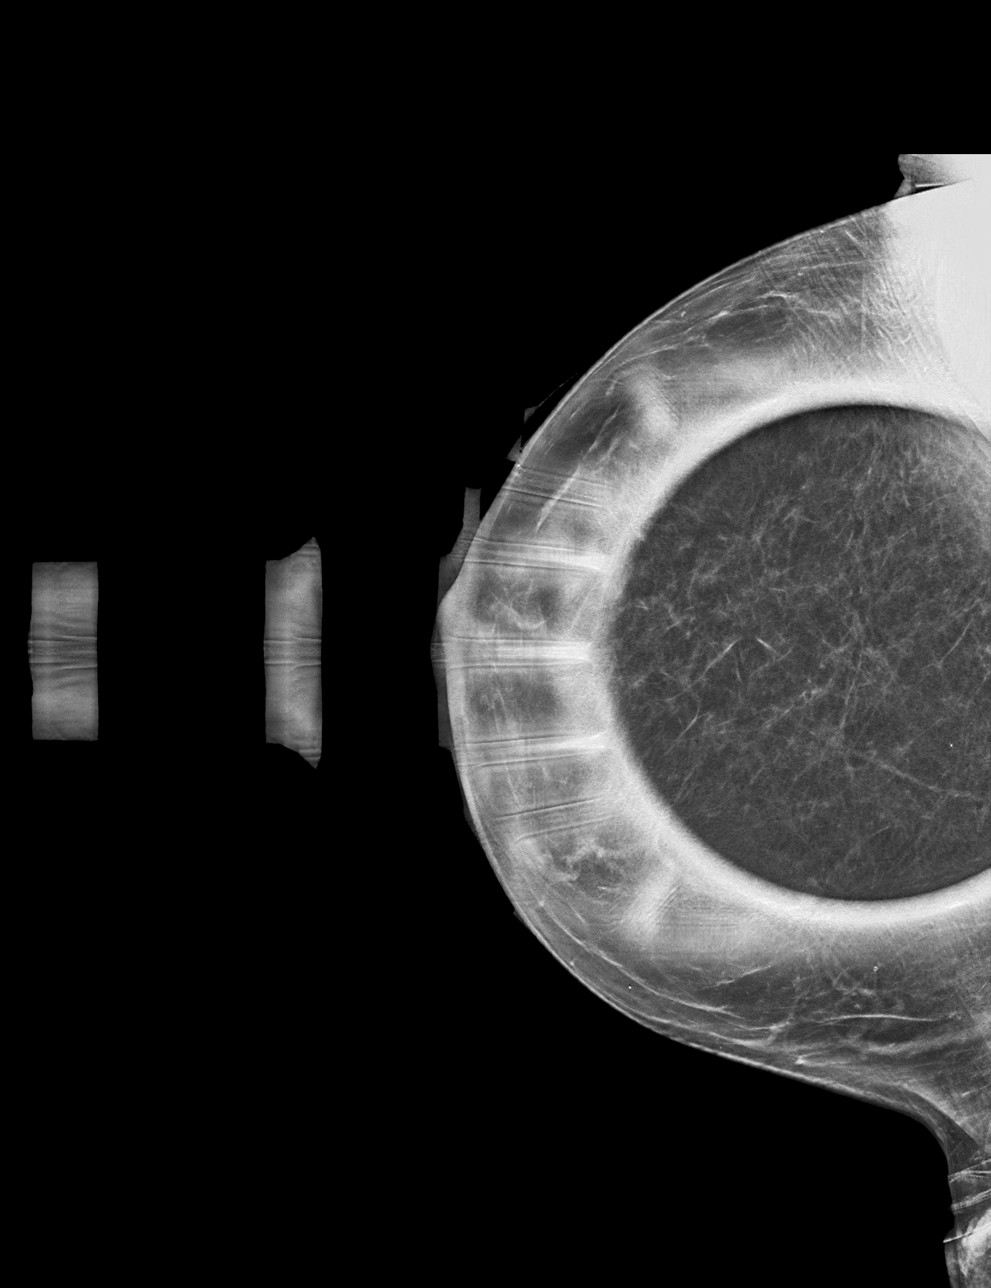

[R MLO tomo · tomo slice 24/47.0]
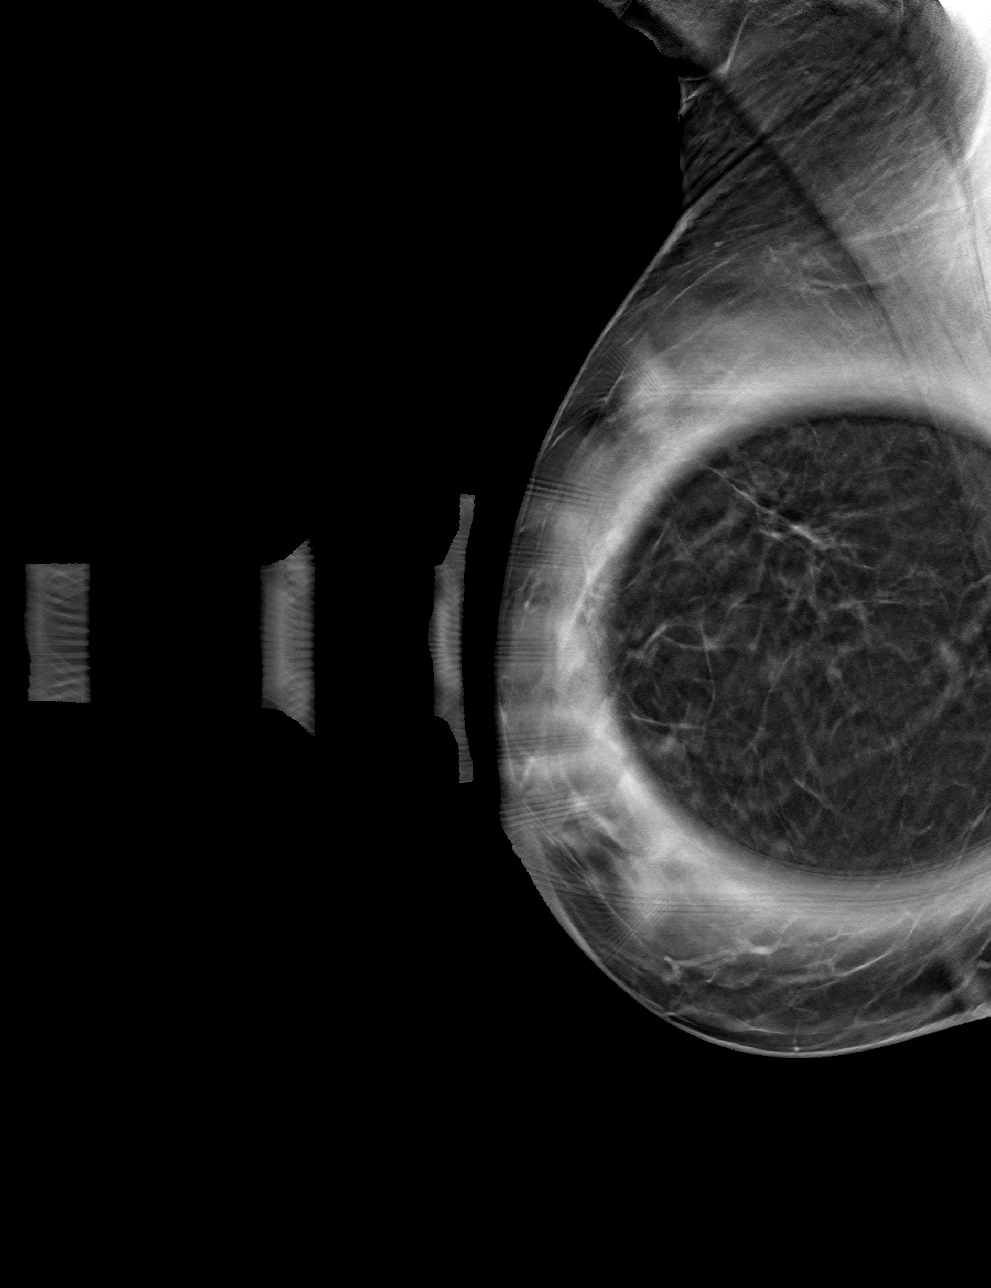

[R CC tomo · tomo slice 25/48.0]
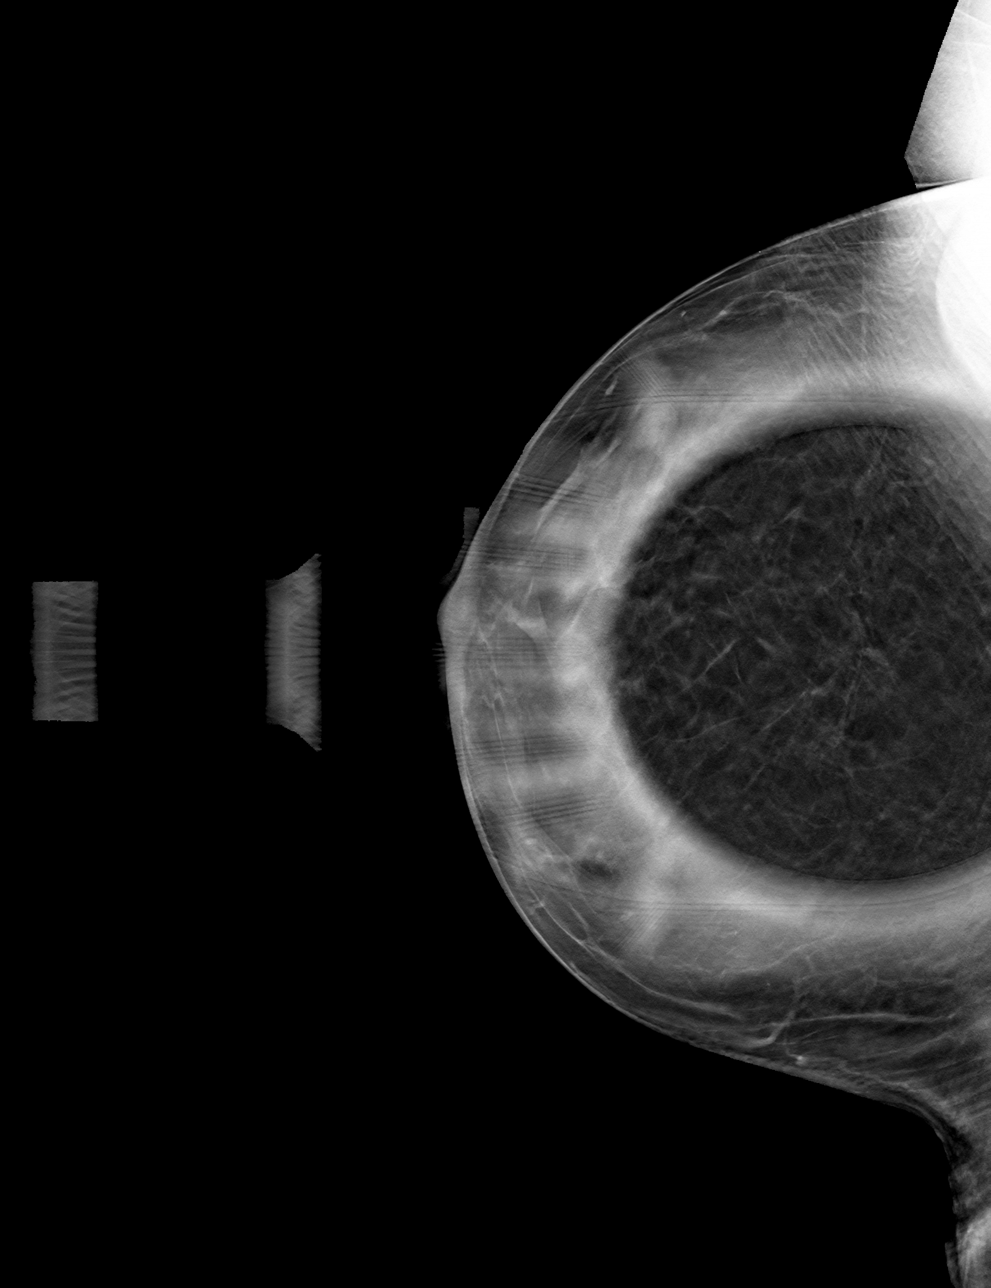

[4 of 12 positions shown; findings below may reference images not displayed]

ACR Breast Density Category b: There are scattered areas of
fibroglandular density.
FINDINGS: Additional 2-D and 3-D images are performed. These views showing no
persistent asymmetry in the UPPER OUTER QUADRANT of the RIGHT
breast.
IMPRESSION: No suspicious mass, distortion, or microcalcifications are
identified to suggest presence of malignancy.

RECOMMENDATION:
Screening mammogram in one year.(Code:[id number])

I have discussed the findings and recommendations with the patient.
If applicable, a reminder letter will be sent to the patient
regarding the next appointment.

BI-RADS CATEGORY  1: Negative.

## 2022-04-05 DIAGNOSIS — Z124 Encounter for screening for malignant neoplasm of cervix: Secondary | ICD-10-CM | POA: Diagnosis not present

## 2022-04-05 DIAGNOSIS — Z1151 Encounter for screening for human papillomavirus (HPV): Secondary | ICD-10-CM | POA: Diagnosis not present

## 2022-04-05 DIAGNOSIS — E669 Obesity, unspecified: Secondary | ICD-10-CM | POA: Diagnosis not present

## 2022-04-05 DIAGNOSIS — Z1272 Encounter for screening for malignant neoplasm of vagina: Secondary | ICD-10-CM | POA: Diagnosis not present

## 2022-04-05 DIAGNOSIS — Z01419 Encounter for gynecological examination (general) (routine) without abnormal findings: Secondary | ICD-10-CM | POA: Diagnosis not present

## 2022-04-05 DIAGNOSIS — Z0142 Encounter for cervical smear to confirm findings of recent normal smear following initial abnormal smear: Secondary | ICD-10-CM | POA: Diagnosis not present

## 2022-04-05 DIAGNOSIS — Z1389 Encounter for screening for other disorder: Secondary | ICD-10-CM | POA: Diagnosis not present

## 2022-04-05 DIAGNOSIS — Z1231 Encounter for screening mammogram for malignant neoplasm of breast: Secondary | ICD-10-CM | POA: Diagnosis not present

## 2022-04-05 DIAGNOSIS — Z130 Encounter for screening for diseases of the blood and blood-forming organs and certain disorders involving the immune mechanism: Secondary | ICD-10-CM | POA: Diagnosis not present

## 2022-04-18 DIAGNOSIS — M5451 Vertebrogenic low back pain: Secondary | ICD-10-CM | POA: Diagnosis not present

## 2022-04-24 DIAGNOSIS — R7989 Other specified abnormal findings of blood chemistry: Secondary | ICD-10-CM | POA: Diagnosis not present

## 2022-04-25 DIAGNOSIS — Z01419 Encounter for gynecological examination (general) (routine) without abnormal findings: Secondary | ICD-10-CM | POA: Diagnosis not present

## 2022-05-01 DIAGNOSIS — M5451 Vertebrogenic low back pain: Secondary | ICD-10-CM | POA: Diagnosis not present

## 2022-05-12 DIAGNOSIS — M5451 Vertebrogenic low back pain: Secondary | ICD-10-CM | POA: Diagnosis not present

## 2022-05-15 DIAGNOSIS — K219 Gastro-esophageal reflux disease without esophagitis: Secondary | ICD-10-CM | POA: Diagnosis not present

## 2022-05-15 DIAGNOSIS — M810 Age-related osteoporosis without current pathological fracture: Secondary | ICD-10-CM | POA: Diagnosis not present

## 2022-05-15 DIAGNOSIS — D649 Anemia, unspecified: Secondary | ICD-10-CM | POA: Diagnosis not present

## 2022-05-15 DIAGNOSIS — S32000S Wedge compression fracture of unspecified lumbar vertebra, sequela: Secondary | ICD-10-CM | POA: Diagnosis not present

## 2022-05-30 DIAGNOSIS — M5451 Vertebrogenic low back pain: Secondary | ICD-10-CM | POA: Diagnosis not present

## 2022-06-06 DIAGNOSIS — M5451 Vertebrogenic low back pain: Secondary | ICD-10-CM | POA: Diagnosis not present

## 2022-06-21 DIAGNOSIS — M5451 Vertebrogenic low back pain: Secondary | ICD-10-CM | POA: Diagnosis not present

## 2022-06-29 DIAGNOSIS — M810 Age-related osteoporosis without current pathological fracture: Secondary | ICD-10-CM | POA: Diagnosis not present

## 2022-08-31 DIAGNOSIS — H401132 Primary open-angle glaucoma, bilateral, moderate stage: Secondary | ICD-10-CM | POA: Diagnosis not present

## 2022-09-24 ENCOUNTER — Ambulatory Visit
Admission: EM | Admit: 2022-09-24 | Discharge: 2022-09-24 | Disposition: A | Discharge status: Home / Self Care | Payer: Federal, State, Local not specified - PPO | Attending: Emergency Medicine | Admitting: Emergency Medicine

## 2022-09-24 ENCOUNTER — Ambulatory Visit: Payer: Federal, State, Local not specified - PPO

## 2022-09-24 ENCOUNTER — Encounter: Payer: Self-pay | Admitting: Emergency Medicine

## 2022-09-24 DIAGNOSIS — R051 Acute cough: Secondary | ICD-10-CM | POA: Diagnosis not present

## 2022-09-24 DIAGNOSIS — I10 Essential (primary) hypertension: Secondary | ICD-10-CM

## 2022-09-24 DIAGNOSIS — R062 Wheezing: Secondary | ICD-10-CM

## 2022-09-24 DIAGNOSIS — J22 Unspecified acute lower respiratory infection: Secondary | ICD-10-CM | POA: Diagnosis not present

## 2022-09-24 DIAGNOSIS — R059 Cough, unspecified: Secondary | ICD-10-CM | POA: Diagnosis not present

## 2022-09-24 MED ORDER — PROMETHAZINE-DM 6.25-15 MG/5ML PO SYRP: 5.0000 mL | ORAL_SOLUTION | Freq: Four times a day (QID) | ORAL | 0 refills | Status: AC | PRN

## 2022-09-24 MED ORDER — IPRATROPIUM-ALBUTEROL 0.5-2.5 (3) MG/3ML IN SOLN
3.0000 mL | Freq: Once | RESPIRATORY_TRACT | Status: AC
  Administered 2022-09-24: 3 mL via RESPIRATORY_TRACT

## 2022-09-24 MED ORDER — AEROCHAMBER MV MISC: 1 refills | Status: AC

## 2022-09-24 MED ORDER — ALBUTEROL SULFATE HFA 108 (90 BASE) MCG/ACT IN AERS: 1.0000 | INHALATION_SPRAY | RESPIRATORY_TRACT | 0 refills | Status: AC | PRN

## 2022-09-24 MED ORDER — PREDNISONE 20 MG PO TABS: 40.0000 mg | ORAL_TABLET | Freq: Every day | ORAL | 0 refills | Status: AC

## 2022-09-24 MED ORDER — AMOXICILLIN 500 MG PO TABS: 1000.0000 mg | ORAL_TABLET | Freq: Three times a day (TID) | ORAL | 0 refills | Status: AC

## 2022-09-24 NOTE — ED Triage Notes (Signed)
Wheezing, cough, no appetite x 1.5 weeks

## 2022-09-24 NOTE — ED Provider Notes (Signed)
HPI  SUBJECTIVE:  Brooke Welch is a 61 y.o. female who presents with 1.5 weeks of dry cough, wheezing, sinus pain and pressure, shortness of breath and fatigue.  She is unable to sleep at night secondary to the cough.  No fevers, nasal congestion, rhinorrhea, facial swelling, dental pain, postnasal drip, chest pain, dyspnea on exertion.  No allergy or GERD symptoms.  No antibiotics in the past 3 months.  No antipyretic in the past 6 hours.  She has tried Robitussin without improvement in her symptoms.  No aggravating or alleviating factors.  She has a past medical history of hypertension, is not on any medications, anemia and murmur.  No history of pulmonary disease, smoking.  PCP: Dr. 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   Glaucoma 2012   Headache(784.0)    migraine   Heart murmur    Premature ovarian failure age 53    Past Surgical History:  Procedure Laterality Date   TUBAL LIGATION  1987    Family History  Problem Relation Age of Onset   Diabetes Mother    Hypertension Mother    Diabetes Maternal Grandmother    Hypertension Maternal Grandfather    Heart attack Maternal Grandfather    Hypertension Brother    Asthma Brother    Heart disease Father    Asthma Father    Healthy Son    Drug abuse Daughter    Urticaria Neg Hx    Allergic rhinitis Neg Hx    Angioedema Neg Hx    Atopy Neg Hx    Immunodeficiency Neg Hx    Eczema Neg Hx     Social History   Tobacco Use   Smoking status: Never   Smokeless tobacco: Never  Vaping Use   Vaping Use: Never used  Substance Use Topics   Alcohol use: No   Drug use: No    No current facility-administered medications for this encounter.  Current Outpatient Medications:    albuterol (VENTOLIN HFA) 108 (90 Base) MCG/ACT inhaler, Inhale 1-2 puffs into the lungs every 4 (four) hours as needed for wheezing or shortness of breath., Disp: 1 each, Rfl:  0   amoxicillin (AMOXIL) 500 MG tablet, Take 2 tablets (1,000 mg total) by mouth 3 (three) times daily for 5 days., Disp: 30 tablet, Rfl: 0   predniSONE (DELTASONE) 20 MG tablet, Take 2 tablets (40 mg total) by mouth daily with breakfast for 5 days., Disp: 10 tablet, Rfl: 0   promethazine-dextromethorphan (PROMETHAZINE-DM) 6.25-15 MG/5ML syrup, Take 5 mLs by mouth 4 (four) times daily as needed for cough., Disp: 118 mL, Rfl: 0   Spacer/Aero-Holding Chambers (AEROCHAMBER MV) inhaler, Use as instructed, Disp: 1 each, Rfl: 1   Calcium Carbonate-Vit D-Min (CALCIUM 1200 PO), Take 1 tablet by mouth daily., Disp: , Rfl:    cimetidine (TAGAMET) 400 MG tablet, Take 400 mg by mouth 3 (three) times daily., Disp: , Rfl:    EPINEPHrine (EPIPEN 2-PAK) 0.3 mg/0.3 mL IJ SOAJ injection, Inject 0.3 mLs (0.3 mg total) into the muscle once., Disp: 2 Device, Rfl: 2   ferrous sulfate 325 (65 FE) MG tablet, Take 325 mg by mouth 3 (three) times daily., Disp: , Rfl: 4   LUMIGAN 0.01 % SOLN, Place 1 drop into both eyes at bedtime., Disp: , Rfl:    timolol (TIMOPTIC-XR) 0.5 % ophthalmic gel-forming, Reported on 03/16/2016, Disp: , Rfl: 3  No Known Allergies   ROS  As noted in HPI.   Physical Exam  BP (!) 175/107 (BP Location: Right Arm)   Pulse 73   Temp 98.7 F (37.1 C) (Oral)   Resp 18   SpO2 97%   BP Readings from Last 3 Encounters:  09/24/22 (!) 175/107  03/13/19 (!) 163/102  02/18/19 (!) 162/103     Constitutional: Well developed, well nourished, no acute distress Eyes:  EOMI, conjunctiva normal bilaterally HENT: Normocephalic, atraumatic,mucus membranes moist no nasal congestion, sinus tenderness. Respiratory: Normal inspiratory effort, diffuse expiratory wheezing throughout all lung fields.  No anterior, lateral chest wall tenderness. Cardiovascular: Normal rate, regular rhythm, no appreciable murmurs, rubs, gallops GI: nondistended skin: No rash, skin intact Musculoskeletal: no  deformities Neurologic: Alert & oriented x 3, no focal neuro deficits Psychiatric: Speech and behavior appropriate   ED Course   Medications  ipratropium-albuterol (DUONEB) 0.5-2.5 (3) MG/3ML nebulizer solution 3 mL (3 mLs Nebulization Given 09/24/22 0946)    Orders Placed This Encounter  Procedures   DG Chest 2 View    Standing Status:   Standing    Number of Occurrences:   1    Order Specific Question:   Reason for Exam (SYMPTOM  OR DIAGNOSIS REQUIRED)    Answer:   Cough, Wheeze 1.5 weeks rule out pneumonia    No results found for this or any previous visit (from the past 24 hour(s)). DG Chest 2 View  Result Date: 09/24/2022 CLINICAL DATA:  Cough with wheeze for 1.5 weeks, rule out pneumonia EXAM: CHEST - 2 VIEW COMPARISON:  11/08/2005 FINDINGS: Normal heart size and mediastinal contours when accounting for prominent thoracolumbar levoscoliosis. There is no edema, consolidation, effusion, or pneumothorax. Biapical pleural based scarring. IMPRESSION: No evidence of active disease. Electronically Signed   By: Jorje Guild M.D.   On: 09/24/2022 09:57    ED Clinical Impression  1. Lower respiratory tract infection   2. Wheezing   3. Acute cough   4. Elevated blood pressure reading with diagnosis of hypertension      ED Assessment/Plan      1.  Wheezing, cough-concern for pneumonia.  Checking chest x-ray to evaluate for this.  Giving DuoNeb because of the diffuse wheezing.  Reviewed imaging independently.  No edema, consolidation, effusion.  No evidence of pneumonia.  Prominent thoracolumbar levoscoliosis.  See radiology report for details.  On reevaluation, patient states that she feels about the same.  However, she has improved air movement, loud wheezing throughout all lung fields.  While her chest x-ray is negative for pneumonia, I am concerned that she could have a secondary lower respiratory tract infection given duration of symptoms.  Will send home with amoxicillin  1 g 3 times daily for 5 days, prednisone 40 mg for 5 days, regularly scheduled albuterol inhaler with a spacer, Promethazine DM.  Follow-up with PCP as needed.   2.  Elevated blood pressure. Pt hypertensive today.  She does not take any medications for this.  Pt has no historical evidence of end organ damage. Pt denies any CNS type sx such as HA, visual changes, focal paresis, or new onset seizure activity. Pt denies any CV sx such as CP, palpitations, pedal edema, tearing pain radiating to back or abd. Pt denied any renal sx such as anuria or hematuria. . Discussed importance of lifestyle modifications as important first steps, and advised that she needs to follow-up with her PCP for possible medication management.  She is to follow-up as  an outpatient.  ER return precautions given.    Discussed labs, imaging, MDM, treatment plan, and plan for follow-up with patient. Discussed sn/sx that should prompt return to the ED. patient agrees with plan.   Meds ordered this encounter  Medications   ipratropium-albuterol (DUONEB) 0.5-2.5 (3) MG/3ML nebulizer solution 3 mL   albuterol (VENTOLIN HFA) 108 (90 Base) MCG/ACT inhaler    Sig: Inhale 1-2 puffs into the lungs every 4 (four) hours as needed for wheezing or shortness of breath.    Dispense:  1 each    Refill:  0   Spacer/Aero-Holding Chambers (AEROCHAMBER MV) inhaler    Sig: Use as instructed    Dispense:  1 each    Refill:  1   predniSONE (DELTASONE) 20 MG tablet    Sig: Take 2 tablets (40 mg total) by mouth daily with breakfast for 5 days.    Dispense:  10 tablet    Refill:  0   promethazine-dextromethorphan (PROMETHAZINE-DM) 6.25-15 MG/5ML syrup    Sig: Take 5 mLs by mouth 4 (four) times daily as needed for cough.    Dispense:  118 mL    Refill:  0   amoxicillin (AMOXIL) 500 MG tablet    Sig: Take 2 tablets (1,000 mg total) by mouth 3 (three) times daily for 5 days.    Dispense:  30 tablet    Refill:  0      *This clinic note was  created using Scientist, clinical (histocompatibility and immunogenetics). Therefore, there may be occasional mistakes despite careful proofreading.  ?    Domenick Gong, MD 09/24/22 1149

## 2022-09-24 NOTE — Discharge Instructions (Addendum)
Your chest x-ray was negative for pneumonia, however, given the duration of your symptoms, I am concerned that you have a secondary lower respiratory tract infection.  Finish the amoxicillin, if you feel better.  2 puffs from your albuterol inhaler using your spacer every 4 hours for 2 days, then every 6 hours for 2 days, then as needed.  Finish the prednisone, even if you feel better.  Promethazine DM for cough.  Decrease your salt intake. diet and exercise will lower your blood pressure significantly. It is important to keep your blood pressure under good control, as having a elevated blood pressure for prolonged periods of time significantly increases your risk of stroke, heart attacks, kidney damage, eye damage, and other problems. Measure your blood pressure once a day, preferably at the same time every day. Keep a log of this and bring it to your next doctor's appointment.  Bring your blood pressure cuff as well.   Return immediately to the ER if you start having chest pain, headache, problems seeing, problems talking, problems walking, if you feel like you're about to pass out, if you do pass out, if you have a seizure, or for any other concerns.  Go to www.goodrx.com  or www.costplusdrugs.com to look up your medications. This will give you a list of where you can find your prescriptions at the most affordable prices. Or ask the pharmacist what the cash price is, or if they have any other discount programs available to help make your medication more affordable. This can be less expensive than what you would pay with insurance.

## 2022-10-19 DIAGNOSIS — H401132 Primary open-angle glaucoma, bilateral, moderate stage: Secondary | ICD-10-CM | POA: Diagnosis not present

## 2022-11-28 DIAGNOSIS — M5451 Vertebrogenic low back pain: Secondary | ICD-10-CM | POA: Diagnosis not present

## 2023-01-02 DIAGNOSIS — M5451 Vertebrogenic low back pain: Secondary | ICD-10-CM | POA: Diagnosis not present

## 2023-01-02 DIAGNOSIS — M47816 Spondylosis without myelopathy or radiculopathy, lumbar region: Secondary | ICD-10-CM | POA: Diagnosis not present

## 2023-01-04 DIAGNOSIS — H401132 Primary open-angle glaucoma, bilateral, moderate stage: Secondary | ICD-10-CM | POA: Diagnosis not present

## 2023-01-18 DIAGNOSIS — M810 Age-related osteoporosis without current pathological fracture: Secondary | ICD-10-CM | POA: Diagnosis not present

## 2023-01-23 DIAGNOSIS — M810 Age-related osteoporosis without current pathological fracture: Secondary | ICD-10-CM | POA: Diagnosis not present

## 2023-01-23 DIAGNOSIS — M489 Spondylopathy, unspecified: Secondary | ICD-10-CM | POA: Diagnosis not present

## 2023-01-23 DIAGNOSIS — M419 Scoliosis, unspecified: Secondary | ICD-10-CM | POA: Diagnosis not present

## 2023-01-23 DIAGNOSIS — S32000A Wedge compression fracture of unspecified lumbar vertebra, initial encounter for closed fracture: Secondary | ICD-10-CM | POA: Diagnosis not present

## 2023-01-25 ENCOUNTER — Other Ambulatory Visit: Payer: Self-pay | Admitting: Orthopaedic Surgery

## 2023-01-25 DIAGNOSIS — M419 Scoliosis, unspecified: Secondary | ICD-10-CM

## 2023-01-29 DIAGNOSIS — M810 Age-related osteoporosis without current pathological fracture: Secondary | ICD-10-CM | POA: Diagnosis not present

## 2023-01-31 ENCOUNTER — Ambulatory Visit
Admission: RE | Admit: 2023-01-31 | Discharge: 2023-01-31 | Disposition: A | Discharge status: Home / Self Care | Payer: Federal, State, Local not specified - PPO | Source: Ambulatory Visit | Attending: Orthopaedic Surgery | Admitting: Orthopaedic Surgery

## 2023-01-31 DIAGNOSIS — R200 Anesthesia of skin: Secondary | ICD-10-CM | POA: Diagnosis not present

## 2023-01-31 DIAGNOSIS — M48061 Spinal stenosis, lumbar region without neurogenic claudication: Secondary | ICD-10-CM | POA: Diagnosis not present

## 2023-01-31 DIAGNOSIS — M545 Low back pain, unspecified: Secondary | ICD-10-CM | POA: Diagnosis not present

## 2023-01-31 DIAGNOSIS — M419 Scoliosis, unspecified: Secondary | ICD-10-CM

## 2023-01-31 DIAGNOSIS — M4316 Spondylolisthesis, lumbar region: Secondary | ICD-10-CM | POA: Diagnosis not present

## 2023-02-20 DIAGNOSIS — M419 Scoliosis, unspecified: Secondary | ICD-10-CM | POA: Diagnosis not present

## 2023-02-22 DIAGNOSIS — M419 Scoliosis, unspecified: Secondary | ICD-10-CM | POA: Diagnosis not present

## 2023-02-22 DIAGNOSIS — M4316 Spondylolisthesis, lumbar region: Secondary | ICD-10-CM | POA: Diagnosis not present

## 2023-02-22 DIAGNOSIS — M48062 Spinal stenosis, lumbar region with neurogenic claudication: Secondary | ICD-10-CM | POA: Diagnosis not present

## 2023-02-22 DIAGNOSIS — M5416 Radiculopathy, lumbar region: Secondary | ICD-10-CM | POA: Diagnosis not present

## 2023-03-20 DIAGNOSIS — M5416 Radiculopathy, lumbar region: Secondary | ICD-10-CM | POA: Diagnosis not present

## 2023-04-10 DIAGNOSIS — M419 Scoliosis, unspecified: Secondary | ICD-10-CM | POA: Diagnosis not present

## 2023-04-10 DIAGNOSIS — M5416 Radiculopathy, lumbar region: Secondary | ICD-10-CM | POA: Diagnosis not present

## 2023-05-10 DIAGNOSIS — M48062 Spinal stenosis, lumbar region with neurogenic claudication: Secondary | ICD-10-CM | POA: Diagnosis not present

## 2023-05-10 DIAGNOSIS — M5416 Radiculopathy, lumbar region: Secondary | ICD-10-CM | POA: Diagnosis not present

## 2023-05-10 DIAGNOSIS — M419 Scoliosis, unspecified: Secondary | ICD-10-CM | POA: Diagnosis not present

## 2023-05-14 DIAGNOSIS — M810 Age-related osteoporosis without current pathological fracture: Secondary | ICD-10-CM | POA: Diagnosis not present

## 2023-05-15 DIAGNOSIS — S32000S Wedge compression fracture of unspecified lumbar vertebra, sequela: Secondary | ICD-10-CM | POA: Diagnosis not present

## 2023-05-15 DIAGNOSIS — M810 Age-related osteoporosis without current pathological fracture: Secondary | ICD-10-CM | POA: Diagnosis not present

## 2023-06-07 DIAGNOSIS — M48062 Spinal stenosis, lumbar region with neurogenic claudication: Secondary | ICD-10-CM | POA: Diagnosis not present

## 2023-06-20 DIAGNOSIS — M810 Age-related osteoporosis without current pathological fracture: Secondary | ICD-10-CM | POA: Diagnosis not present

## 2023-07-09 DIAGNOSIS — Z01419 Encounter for gynecological examination (general) (routine) without abnormal findings: Secondary | ICD-10-CM | POA: Diagnosis not present

## 2023-07-09 DIAGNOSIS — Z1231 Encounter for screening mammogram for malignant neoplasm of breast: Secondary | ICD-10-CM | POA: Diagnosis not present

## 2023-07-21 DIAGNOSIS — Z1211 Encounter for screening for malignant neoplasm of colon: Secondary | ICD-10-CM | POA: Diagnosis not present

## 2023-07-26 LAB — COLOGUARD: COLOGUARD: POSITIVE — AB

## 2023-07-26 LAB — EXTERNAL GENERIC LAB PROCEDURE: COLOGUARD: POSITIVE — AB

## 2023-08-07 DIAGNOSIS — K5901 Slow transit constipation: Secondary | ICD-10-CM | POA: Diagnosis not present

## 2023-08-07 DIAGNOSIS — R195 Other fecal abnormalities: Secondary | ICD-10-CM | POA: Diagnosis not present

## 2023-08-07 DIAGNOSIS — Z1211 Encounter for screening for malignant neoplasm of colon: Secondary | ICD-10-CM | POA: Diagnosis not present

## 2023-08-07 DIAGNOSIS — K625 Hemorrhage of anus and rectum: Secondary | ICD-10-CM | POA: Diagnosis not present

## 2023-08-09 DIAGNOSIS — H401132 Primary open-angle glaucoma, bilateral, moderate stage: Secondary | ICD-10-CM | POA: Diagnosis not present

## 2023-09-03 DIAGNOSIS — D120 Benign neoplasm of cecum: Secondary | ICD-10-CM | POA: Diagnosis not present

## 2023-09-03 DIAGNOSIS — Z1211 Encounter for screening for malignant neoplasm of colon: Secondary | ICD-10-CM | POA: Diagnosis not present

## 2023-09-03 DIAGNOSIS — K635 Polyp of colon: Secondary | ICD-10-CM | POA: Diagnosis not present

## 2023-09-03 DIAGNOSIS — K6389 Other specified diseases of intestine: Secondary | ICD-10-CM | POA: Diagnosis not present

## 2023-09-03 DIAGNOSIS — K5730 Diverticulosis of large intestine without perforation or abscess without bleeding: Secondary | ICD-10-CM | POA: Diagnosis not present

## 2023-09-03 DIAGNOSIS — R195 Other fecal abnormalities: Secondary | ICD-10-CM | POA: Diagnosis not present

## 2023-10-19 DIAGNOSIS — M48062 Spinal stenosis, lumbar region with neurogenic claudication: Secondary | ICD-10-CM | POA: Diagnosis not present

## 2023-10-22 ENCOUNTER — Other Ambulatory Visit: Payer: Self-pay | Admitting: Physician Assistant

## 2023-10-22 DIAGNOSIS — M4316 Spondylolisthesis, lumbar region: Secondary | ICD-10-CM

## 2023-10-22 DIAGNOSIS — M48062 Spinal stenosis, lumbar region with neurogenic claudication: Secondary | ICD-10-CM

## 2023-11-07 DIAGNOSIS — M48062 Spinal stenosis, lumbar region with neurogenic claudication: Secondary | ICD-10-CM | POA: Diagnosis not present

## 2023-11-09 ENCOUNTER — Ambulatory Visit
Admission: RE | Admit: 2023-11-09 | Discharge: 2023-11-09 | Disposition: A | Discharge status: Home / Self Care | Payer: Federal, State, Local not specified - PPO | Source: Ambulatory Visit | Attending: Physician Assistant

## 2023-11-09 DIAGNOSIS — M47816 Spondylosis without myelopathy or radiculopathy, lumbar region: Secondary | ICD-10-CM | POA: Diagnosis not present

## 2023-11-09 DIAGNOSIS — M5126 Other intervertebral disc displacement, lumbar region: Secondary | ICD-10-CM | POA: Diagnosis not present

## 2023-11-09 DIAGNOSIS — M48061 Spinal stenosis, lumbar region without neurogenic claudication: Secondary | ICD-10-CM | POA: Diagnosis not present

## 2023-11-09 DIAGNOSIS — M48062 Spinal stenosis, lumbar region with neurogenic claudication: Secondary | ICD-10-CM

## 2023-11-09 DIAGNOSIS — M4316 Spondylolisthesis, lumbar region: Secondary | ICD-10-CM

## 2023-11-14 DIAGNOSIS — M810 Age-related osteoporosis without current pathological fracture: Secondary | ICD-10-CM | POA: Diagnosis not present

## 2023-12-10 DIAGNOSIS — I10 Essential (primary) hypertension: Secondary | ICD-10-CM | POA: Diagnosis not present

## 2023-12-10 DIAGNOSIS — S32000S Wedge compression fracture of unspecified lumbar vertebra, sequela: Secondary | ICD-10-CM | POA: Diagnosis not present

## 2023-12-10 DIAGNOSIS — M810 Age-related osteoporosis without current pathological fracture: Secondary | ICD-10-CM | POA: Diagnosis not present

## 2023-12-14 DIAGNOSIS — M48062 Spinal stenosis, lumbar region with neurogenic claudication: Secondary | ICD-10-CM | POA: Diagnosis not present

## 2023-12-14 DIAGNOSIS — M4316 Spondylolisthesis, lumbar region: Secondary | ICD-10-CM | POA: Diagnosis not present

## 2023-12-20 DIAGNOSIS — Z01810 Encounter for preprocedural cardiovascular examination: Secondary | ICD-10-CM | POA: Diagnosis not present

## 2023-12-20 DIAGNOSIS — M48062 Spinal stenosis, lumbar region with neurogenic claudication: Secondary | ICD-10-CM | POA: Diagnosis not present

## 2023-12-20 DIAGNOSIS — Z79899 Other long term (current) drug therapy: Secondary | ICD-10-CM | POA: Diagnosis not present

## 2023-12-24 DIAGNOSIS — M4726 Other spondylosis with radiculopathy, lumbar region: Secondary | ICD-10-CM | POA: Diagnosis not present

## 2023-12-24 DIAGNOSIS — Z981 Arthrodesis status: Secondary | ICD-10-CM | POA: Diagnosis not present

## 2023-12-24 DIAGNOSIS — M8568 Other cyst of bone, other site: Secondary | ICD-10-CM | POA: Diagnosis not present

## 2023-12-24 DIAGNOSIS — M532X6 Spinal instabilities, lumbar region: Secondary | ICD-10-CM | POA: Diagnosis not present

## 2023-12-24 DIAGNOSIS — M8008XA Age-related osteoporosis with current pathological fracture, vertebra(e), initial encounter for fracture: Secondary | ICD-10-CM | POA: Diagnosis not present

## 2023-12-24 DIAGNOSIS — M5116 Intervertebral disc disorders with radiculopathy, lumbar region: Secondary | ICD-10-CM | POA: Diagnosis not present

## 2023-12-24 DIAGNOSIS — M4316 Spondylolisthesis, lumbar region: Secondary | ICD-10-CM | POA: Diagnosis not present

## 2023-12-24 DIAGNOSIS — M48062 Spinal stenosis, lumbar region with neurogenic claudication: Secondary | ICD-10-CM | POA: Diagnosis not present

## 2023-12-24 DIAGNOSIS — Z9689 Presence of other specified functional implants: Secondary | ICD-10-CM | POA: Diagnosis not present

## 2023-12-24 DIAGNOSIS — M4317 Spondylolisthesis, lumbosacral region: Secondary | ICD-10-CM | POA: Diagnosis not present

## 2023-12-24 DIAGNOSIS — M5416 Radiculopathy, lumbar region: Secondary | ICD-10-CM | POA: Diagnosis not present

## 2023-12-25 DIAGNOSIS — Z4789 Encounter for other orthopedic aftercare: Secondary | ICD-10-CM | POA: Diagnosis not present

## 2023-12-25 DIAGNOSIS — M5116 Intervertebral disc disorders with radiculopathy, lumbar region: Secondary | ICD-10-CM | POA: Diagnosis not present

## 2023-12-25 DIAGNOSIS — M8568 Other cyst of bone, other site: Secondary | ICD-10-CM | POA: Diagnosis not present

## 2023-12-25 DIAGNOSIS — M48062 Spinal stenosis, lumbar region with neurogenic claudication: Secondary | ICD-10-CM | POA: Diagnosis not present

## 2023-12-25 DIAGNOSIS — M4185 Other forms of scoliosis, thoracolumbar region: Secondary | ICD-10-CM | POA: Diagnosis not present

## 2023-12-25 DIAGNOSIS — M8008XA Age-related osteoporosis with current pathological fracture, vertebra(e), initial encounter for fracture: Secondary | ICD-10-CM | POA: Diagnosis not present

## 2023-12-25 DIAGNOSIS — M4726 Other spondylosis with radiculopathy, lumbar region: Secondary | ICD-10-CM | POA: Diagnosis not present

## 2023-12-25 DIAGNOSIS — Z981 Arthrodesis status: Secondary | ICD-10-CM | POA: Diagnosis not present

## 2024-01-01 DIAGNOSIS — M48062 Spinal stenosis, lumbar region with neurogenic claudication: Secondary | ICD-10-CM | POA: Diagnosis not present

## 2024-01-01 DIAGNOSIS — I10 Essential (primary) hypertension: Secondary | ICD-10-CM | POA: Diagnosis not present

## 2024-01-01 DIAGNOSIS — Z981 Arthrodesis status: Secondary | ICD-10-CM | POA: Diagnosis not present

## 2024-01-01 DIAGNOSIS — Z4789 Encounter for other orthopedic aftercare: Secondary | ICD-10-CM | POA: Diagnosis not present

## 2024-01-01 DIAGNOSIS — M8008XD Age-related osteoporosis with current pathological fracture, vertebra(e), subsequent encounter for fracture with routine healing: Secondary | ICD-10-CM | POA: Diagnosis not present

## 2024-01-01 DIAGNOSIS — M47814 Spondylosis without myelopathy or radiculopathy, thoracic region: Secondary | ICD-10-CM | POA: Diagnosis not present

## 2024-01-01 DIAGNOSIS — M4726 Other spondylosis with radiculopathy, lumbar region: Secondary | ICD-10-CM | POA: Diagnosis not present

## 2024-01-01 DIAGNOSIS — Z7982 Long term (current) use of aspirin: Secondary | ICD-10-CM | POA: Diagnosis not present

## 2024-01-01 DIAGNOSIS — M4316 Spondylolisthesis, lumbar region: Secondary | ICD-10-CM | POA: Diagnosis not present

## 2024-01-01 DIAGNOSIS — G834 Cauda equina syndrome: Secondary | ICD-10-CM | POA: Diagnosis not present

## 2024-01-09 DIAGNOSIS — G834 Cauda equina syndrome: Secondary | ICD-10-CM | POA: Diagnosis not present

## 2024-01-09 DIAGNOSIS — M48062 Spinal stenosis, lumbar region with neurogenic claudication: Secondary | ICD-10-CM | POA: Diagnosis not present

## 2024-01-09 DIAGNOSIS — M8008XD Age-related osteoporosis with current pathological fracture, vertebra(e), subsequent encounter for fracture with routine healing: Secondary | ICD-10-CM | POA: Diagnosis not present

## 2024-01-09 DIAGNOSIS — Z7982 Long term (current) use of aspirin: Secondary | ICD-10-CM | POA: Diagnosis not present

## 2024-01-09 DIAGNOSIS — I10 Essential (primary) hypertension: Secondary | ICD-10-CM | POA: Diagnosis not present

## 2024-01-09 DIAGNOSIS — Z981 Arthrodesis status: Secondary | ICD-10-CM | POA: Diagnosis not present

## 2024-01-09 DIAGNOSIS — M4726 Other spondylosis with radiculopathy, lumbar region: Secondary | ICD-10-CM | POA: Diagnosis not present

## 2024-01-09 DIAGNOSIS — Z4789 Encounter for other orthopedic aftercare: Secondary | ICD-10-CM | POA: Diagnosis not present

## 2024-01-09 DIAGNOSIS — M4316 Spondylolisthesis, lumbar region: Secondary | ICD-10-CM | POA: Diagnosis not present

## 2024-01-09 DIAGNOSIS — M47814 Spondylosis without myelopathy or radiculopathy, thoracic region: Secondary | ICD-10-CM | POA: Diagnosis not present

## 2024-01-18 DIAGNOSIS — Z4789 Encounter for other orthopedic aftercare: Secondary | ICD-10-CM | POA: Diagnosis not present

## 2024-01-18 DIAGNOSIS — M47814 Spondylosis without myelopathy or radiculopathy, thoracic region: Secondary | ICD-10-CM | POA: Diagnosis not present

## 2024-01-18 DIAGNOSIS — M48062 Spinal stenosis, lumbar region with neurogenic claudication: Secondary | ICD-10-CM | POA: Diagnosis not present

## 2024-01-18 DIAGNOSIS — I10 Essential (primary) hypertension: Secondary | ICD-10-CM | POA: Diagnosis not present

## 2024-01-18 DIAGNOSIS — Z7982 Long term (current) use of aspirin: Secondary | ICD-10-CM | POA: Diagnosis not present

## 2024-01-18 DIAGNOSIS — Z981 Arthrodesis status: Secondary | ICD-10-CM | POA: Diagnosis not present

## 2024-01-18 DIAGNOSIS — M8008XD Age-related osteoporosis with current pathological fracture, vertebra(e), subsequent encounter for fracture with routine healing: Secondary | ICD-10-CM | POA: Diagnosis not present

## 2024-01-18 DIAGNOSIS — G834 Cauda equina syndrome: Secondary | ICD-10-CM | POA: Diagnosis not present

## 2024-01-18 DIAGNOSIS — M4726 Other spondylosis with radiculopathy, lumbar region: Secondary | ICD-10-CM | POA: Diagnosis not present

## 2024-01-18 DIAGNOSIS — M4316 Spondylolisthesis, lumbar region: Secondary | ICD-10-CM | POA: Diagnosis not present

## 2024-01-22 DIAGNOSIS — M4316 Spondylolisthesis, lumbar region: Secondary | ICD-10-CM | POA: Diagnosis not present

## 2024-01-23 DIAGNOSIS — Z7982 Long term (current) use of aspirin: Secondary | ICD-10-CM | POA: Diagnosis not present

## 2024-01-23 DIAGNOSIS — Z4789 Encounter for other orthopedic aftercare: Secondary | ICD-10-CM | POA: Diagnosis not present

## 2024-01-23 DIAGNOSIS — M4726 Other spondylosis with radiculopathy, lumbar region: Secondary | ICD-10-CM | POA: Diagnosis not present

## 2024-01-23 DIAGNOSIS — Z981 Arthrodesis status: Secondary | ICD-10-CM | POA: Diagnosis not present

## 2024-01-23 DIAGNOSIS — G834 Cauda equina syndrome: Secondary | ICD-10-CM | POA: Diagnosis not present

## 2024-01-23 DIAGNOSIS — M4316 Spondylolisthesis, lumbar region: Secondary | ICD-10-CM | POA: Diagnosis not present

## 2024-01-23 DIAGNOSIS — M8008XD Age-related osteoporosis with current pathological fracture, vertebra(e), subsequent encounter for fracture with routine healing: Secondary | ICD-10-CM | POA: Diagnosis not present

## 2024-01-23 DIAGNOSIS — M48062 Spinal stenosis, lumbar region with neurogenic claudication: Secondary | ICD-10-CM | POA: Diagnosis not present

## 2024-01-23 DIAGNOSIS — M47814 Spondylosis without myelopathy or radiculopathy, thoracic region: Secondary | ICD-10-CM | POA: Diagnosis not present

## 2024-01-23 DIAGNOSIS — I10 Essential (primary) hypertension: Secondary | ICD-10-CM | POA: Diagnosis not present

## 2024-02-06 DIAGNOSIS — M21372 Foot drop, left foot: Secondary | ICD-10-CM | POA: Diagnosis not present

## 2024-02-08 DIAGNOSIS — M21372 Foot drop, left foot: Secondary | ICD-10-CM | POA: Diagnosis not present

## 2024-02-15 DIAGNOSIS — M21372 Foot drop, left foot: Secondary | ICD-10-CM | POA: Diagnosis not present

## 2024-02-20 DIAGNOSIS — M21372 Foot drop, left foot: Secondary | ICD-10-CM | POA: Diagnosis not present

## 2024-02-22 DIAGNOSIS — M21372 Foot drop, left foot: Secondary | ICD-10-CM | POA: Diagnosis not present

## 2024-02-27 DIAGNOSIS — M21372 Foot drop, left foot: Secondary | ICD-10-CM | POA: Diagnosis not present

## 2024-02-29 DIAGNOSIS — M21372 Foot drop, left foot: Secondary | ICD-10-CM | POA: Diagnosis not present

## 2024-03-05 DIAGNOSIS — M21372 Foot drop, left foot: Secondary | ICD-10-CM | POA: Diagnosis not present

## 2024-03-07 DIAGNOSIS — M21372 Foot drop, left foot: Secondary | ICD-10-CM | POA: Diagnosis not present

## 2024-03-13 DIAGNOSIS — M21372 Foot drop, left foot: Secondary | ICD-10-CM | POA: Diagnosis not present

## 2024-03-19 DIAGNOSIS — M4316 Spondylolisthesis, lumbar region: Secondary | ICD-10-CM | POA: Diagnosis not present

## 2024-03-26 DIAGNOSIS — M21372 Foot drop, left foot: Secondary | ICD-10-CM | POA: Diagnosis not present

## 2024-04-01 DIAGNOSIS — M21372 Foot drop, left foot: Secondary | ICD-10-CM | POA: Diagnosis not present

## 2024-04-03 DIAGNOSIS — M21372 Foot drop, left foot: Secondary | ICD-10-CM | POA: Diagnosis not present

## 2024-04-03 DIAGNOSIS — M4326 Fusion of spine, lumbar region: Secondary | ICD-10-CM | POA: Diagnosis not present

## 2024-04-07 DIAGNOSIS — M21372 Foot drop, left foot: Secondary | ICD-10-CM | POA: Diagnosis not present

## 2024-04-07 DIAGNOSIS — M4326 Fusion of spine, lumbar region: Secondary | ICD-10-CM | POA: Diagnosis not present

## 2024-04-15 DIAGNOSIS — M21372 Foot drop, left foot: Secondary | ICD-10-CM | POA: Diagnosis not present

## 2024-04-15 DIAGNOSIS — M4326 Fusion of spine, lumbar region: Secondary | ICD-10-CM | POA: Diagnosis not present

## 2024-04-22 DIAGNOSIS — M21372 Foot drop, left foot: Secondary | ICD-10-CM | POA: Diagnosis not present

## 2024-04-22 DIAGNOSIS — M4326 Fusion of spine, lumbar region: Secondary | ICD-10-CM | POA: Diagnosis not present

## 2024-06-09 DIAGNOSIS — M810 Age-related osteoporosis without current pathological fracture: Secondary | ICD-10-CM | POA: Diagnosis not present

## 2024-06-24 DIAGNOSIS — Z1330 Encounter for screening examination for mental health and behavioral disorders, unspecified: Secondary | ICD-10-CM | POA: Diagnosis not present

## 2024-06-24 DIAGNOSIS — M48062 Spinal stenosis, lumbar region with neurogenic claudication: Secondary | ICD-10-CM | POA: Diagnosis not present

## 2024-06-24 DIAGNOSIS — M5416 Radiculopathy, lumbar region: Secondary | ICD-10-CM | POA: Diagnosis not present

## 2024-07-03 DIAGNOSIS — K219 Gastro-esophageal reflux disease without esophagitis: Secondary | ICD-10-CM | POA: Diagnosis not present

## 2024-07-03 DIAGNOSIS — M810 Age-related osteoporosis without current pathological fracture: Secondary | ICD-10-CM | POA: Diagnosis not present

## 2024-07-03 DIAGNOSIS — I10 Essential (primary) hypertension: Secondary | ICD-10-CM | POA: Diagnosis not present

## 2024-07-03 DIAGNOSIS — S32000S Wedge compression fracture of unspecified lumbar vertebra, sequela: Secondary | ICD-10-CM | POA: Diagnosis not present

## 2024-08-18 DIAGNOSIS — Z1231 Encounter for screening mammogram for malignant neoplasm of breast: Secondary | ICD-10-CM | POA: Diagnosis not present

## 2024-08-18 DIAGNOSIS — M810 Age-related osteoporosis without current pathological fracture: Secondary | ICD-10-CM | POA: Diagnosis not present

## 2024-08-18 DIAGNOSIS — Z01419 Encounter for gynecological examination (general) (routine) without abnormal findings: Secondary | ICD-10-CM | POA: Diagnosis not present

## 2024-12-12 DIAGNOSIS — H5213 Myopia, bilateral: Secondary | ICD-10-CM | POA: Diagnosis not present

## 2024-12-12 DIAGNOSIS — H401133 Primary open-angle glaucoma, bilateral, severe stage: Secondary | ICD-10-CM | POA: Diagnosis not present

## 2024-12-12 DIAGNOSIS — H2513 Age-related nuclear cataract, bilateral: Secondary | ICD-10-CM | POA: Diagnosis not present
